# Patient Record
Sex: Female | Born: 1990 | Race: White | Hispanic: No | Marital: Married | Smoking: Current some day smoker
Health system: Southern US, Community
[De-identification: ages and names within clinical notes are randomized; demographics above are authoritative.]

## PROBLEM LIST (undated history)

## (undated) HISTORY — PX: TONSILLECTOMY: SUR1361

---
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
## 2014-07-07 ENCOUNTER — Encounter (HOSPITAL_COMMUNITY): Payer: Self-pay | Admitting: Emergency Medicine

## 2014-07-07 ENCOUNTER — Emergency Department (HOSPITAL_COMMUNITY)
Admission: EM | Admit: 2014-07-07 | Discharge: 2014-07-08 | Disposition: A | Discharge status: Home / Self Care | Payer: 59 | Attending: Emergency Medicine | Admitting: Emergency Medicine

## 2014-07-07 DIAGNOSIS — R1011 Right upper quadrant pain: Secondary | ICD-10-CM

## 2014-07-07 DIAGNOSIS — Z79899 Other long term (current) drug therapy: Secondary | ICD-10-CM | POA: Diagnosis not present

## 2014-07-07 DIAGNOSIS — Z720 Tobacco use: Secondary | ICD-10-CM | POA: Diagnosis not present

## 2014-07-07 DIAGNOSIS — Z3202 Encounter for pregnancy test, result negative: Secondary | ICD-10-CM | POA: Insufficient documentation

## 2014-07-07 DIAGNOSIS — Z793 Long term (current) use of hormonal contraceptives: Secondary | ICD-10-CM | POA: Insufficient documentation

## 2014-07-07 DIAGNOSIS — N3000 Acute cystitis without hematuria: Secondary | ICD-10-CM | POA: Insufficient documentation

## 2014-07-07 DIAGNOSIS — R1084 Generalized abdominal pain: Secondary | ICD-10-CM | POA: Diagnosis present

## 2014-07-07 LAB — CBC WITH DIFFERENTIAL/PLATELET
Basophils Absolute: 0 10*3/uL (ref 0.0–0.1)
Basophils Relative: 0 % (ref 0–1)
EOS PCT: 2 % (ref 0–5)
Eosinophils Absolute: 0.2 10*3/uL (ref 0.0–0.7)
HCT: 40.1 % (ref 36.0–46.0)
Hemoglobin: 13.1 g/dL (ref 12.0–15.0)
LYMPHS ABS: 2.3 10*3/uL (ref 0.7–4.0)
LYMPHS PCT: 23 % (ref 12–46)
MCH: 26.8 pg (ref 26.0–34.0)
MCHC: 32.7 g/dL (ref 30.0–36.0)
MCV: 82.2 fL (ref 78.0–100.0)
Monocytes Absolute: 0.4 10*3/uL (ref 0.1–1.0)
Monocytes Relative: 4 % (ref 3–12)
NEUTROS PCT: 71 % (ref 43–77)
Neutro Abs: 7.2 10*3/uL (ref 1.7–7.7)
PLATELETS: 300 10*3/uL (ref 150–400)
RBC: 4.88 MIL/uL (ref 3.87–5.11)
RDW: 12.6 % (ref 11.5–15.5)
WBC: 10.2 10*3/uL (ref 4.0–10.5)

## 2014-07-07 LAB — COMPREHENSIVE METABOLIC PANEL
ALBUMIN: 3.9 g/dL (ref 3.5–5.2)
ALK PHOS: 70 U/L (ref 39–117)
ALT: 14 U/L (ref 0–35)
AST: 13 U/L (ref 0–37)
Anion gap: 5 (ref 5–15)
BILIRUBIN TOTAL: 0.1 mg/dL — AB (ref 0.3–1.2)
BUN: 9 mg/dL (ref 6–23)
CALCIUM: 9 mg/dL (ref 8.4–10.5)
CHLORIDE: 110 mmol/L (ref 96–112)
CO2: 22 mmol/L (ref 19–32)
Creatinine, Ser: 0.61 mg/dL (ref 0.50–1.10)
Glucose, Bld: 93 mg/dL (ref 70–99)
Potassium: 3.8 mmol/L (ref 3.5–5.1)
Sodium: 137 mmol/L (ref 135–145)
TOTAL PROTEIN: 7.7 g/dL (ref 6.0–8.3)

## 2014-07-07 LAB — LIPASE, BLOOD: Lipase: 16 U/L (ref 11–59)

## 2014-07-07 LAB — POC URINE PREG, ED: PREG TEST UR: NEGATIVE

## 2014-07-07 MED ORDER — ONDANSETRON HCL 4 MG/2ML IJ SOLN
4.0000 mg | Freq: Once | INTRAMUSCULAR | Status: AC
  Administered 2014-07-07: 4 mg via INTRAVENOUS
  Filled 2014-07-07: qty 2

## 2014-07-07 NOTE — ED Notes (Signed)
Pt states that she was seen at [facility] and was sent in for r/o appendicitis. Generalized abdominal pain, afebrile, nausea- no vomiting. Alert and oriented.

## 2014-07-07 NOTE — ED Provider Notes (Signed)
CSN: [id number]     Arrival date & time 07/07/14  2006 History   First MD Initiated Contact with Patient 07/07/14 2311     Chief Complaint  Patient presents with  . Abdominal Pain     (Consider location/radiation/quality/duration/timing/severity/associated sxs/prior Treatment) HPI  This is a 24 year old female with about a 4 year history of episodic abdominal pain. She describes the pain as feeling like gas and is associated with belches that tastes like sulfur. The pain is described as diffuse and moves around. There is equivocal exacerbation with eating. She had an episode developed yesterday that got as severe as an 8 out of 10 earlier today but is now 4 out of 10. It is been associated with nausea and retching but no vomiting. It is been associated with diarrhea. She has not had a fever. She was seen at an [place] [facility] earlier today and was sent to the ED for evaluation for appendicitis.  History reviewed. No pertinent past medical history. No past surgical history on file. No family history on file. History  Substance Use Topics  . Smoking status: Current Some Day Smoker  . Smokeless tobacco: Not on file  . Alcohol Use: Yes     Comment: occasional   OB History    No data available     Review of Systems  All other systems reviewed and are negative.   Allergies  Review of patient's allergies indicates no known allergies.  Home Medications   Prior to Admission medications   Medication Sig Start Date End Date Taking? Authorizing Provider  lisdexamfetamine (VYVANSE) 40 MG capsule Take 40 mg by mouth every morning.   Yes Historical Provider, MD  Melatonin 5 MG TABS Take 1 tablet by mouth daily.   Yes Historical Provider, MD  norethindrone-ethinyl estradiol-iron (MICROGESTIN FE,GILDESS FE,LOESTRIN FE) 1.5-30 MG-MCG tablet Take 1 tablet by mouth daily.   Yes Historical Provider, MD   BP 100/57 mmHg  Pulse 88  Temp(Src) 98.2 F (36.8 C) (Oral)  Resp 18  SpO2 100%  LMP  06/17/2014   Physical Exam  General: Well-developed, obese female in no acute distress; appearance consistent with age of record HENT: normocephalic; atraumatic Eyes: pupils equal, round and reactive to light; extraocular muscles intact Neck: supple Heart: regular rate and rhythm Lungs: clear to auscultation bilaterally Abdomen: soft; nondistended; diffusely tender most prominently in the quadrants; no masses or hepatosplenomegaly; bowel sounds present Extremities: No deformity; full range of motion; pulses normal Neurologic: Awake, alert and oriented; motor function intact in all extremities and symmetric; no facial droop Skin: Warm and dry Psychiatric: Normal mood and affect    ED Course  Procedures (including critical care time)   MDM   Nursing notes and vitals signs, including pulse oximetry, reviewed.  Summary of this visit's results, reviewed by myself:  Labs:  Results for orders placed or performed during the hospital encounter of 07/07/14 (from the past 24 hour(s))  CBC with Differential     Status: None   Collection Time: 07/07/14  9:27 PM  Result Value Ref Range   WBC 10.2 4.0 - 10.5 K/uL   RBC 4.88 3.87 - 5.11 MIL/uL   Hemoglobin 13.1 12.0 - 15.0 g/dL   HCT 30.8 65.7 - 84.6 %   MCV 82.2 78.0 - 100.0 fL   MCH 26.8 26.0 - 34.0 pg   MCHC 32.7 30.0 - 36.0 g/dL   RDW 96.2 95.2 - 84.1 %   Platelets 300 150 - 400 K/uL   Neutrophils  Relative % 71 43 - 77 %   Neutro Abs 7.2 1.7 - 7.7 K/uL   Lymphocytes Relative 23 12 - 46 %   Lymphs Abs 2.3 0.7 - 4.0 K/uL   Monocytes Relative 4 3 - 12 %   Monocytes Absolute 0.4 0.1 - 1.0 K/uL   Eosinophils Relative 2 0 - 5 %   Eosinophils Absolute 0.2 0.0 - 0.7 K/uL   Basophils Relative 0 0 - 1 %   Basophils Absolute 0.0 0.0 - 0.1 K/uL  Comprehensive metabolic panel     Status: Abnormal   Collection Time: 07/07/14  9:27 PM  Result Value Ref Range   Sodium 137 135 - 145 mmol/L   Potassium 3.8 3.5 - 5.1 mmol/L   Chloride 110 96  - 112 mmol/L   CO2 22 19 - 32 mmol/L   Glucose, Bld 93 70 - 99 mg/dL   BUN 9 6 - 23 mg/dL   Creatinine, Ser 1.61 0.50 - 1.10 mg/dL   Calcium 9.0 8.4 - 09.6 mg/dL   Total Protein 7.7 6.0 - 8.3 g/dL   Albumin 3.9 3.5 - 5.2 g/dL   AST 13 0 - 37 U/L   ALT 14 0 - 35 U/L   Alkaline Phosphatase 70 39 - 117 U/L   Total Bilirubin 0.1 (L) 0.3 - 1.2 mg/dL   GFR calc non Af Amer >90 >90 mL/min   GFR calc Af Amer >90 >90 mL/min   Anion gap 5 5 - 15  Lipase, blood     Status: None   Collection Time: 07/07/14  9:27 PM  Result Value Ref Range   Lipase 16 11 - 59 U/L  Urinalysis, Routine w reflex microscopic     Status: Abnormal   Collection Time: 07/07/14 11:36 PM  Result Value Ref Range   Color, Urine YELLOW YELLOW   APPearance CLEAR CLEAR   Specific Gravity, Urine 1.008 1.005 - 1.030   pH 5.5 5.0 - 8.0   Glucose, UA NEGATIVE NEGATIVE mg/dL   Hgb urine dipstick NEGATIVE NEGATIVE   Bilirubin Urine NEGATIVE NEGATIVE   Ketones, ur NEGATIVE NEGATIVE mg/dL   Protein, ur NEGATIVE NEGATIVE mg/dL   Urobilinogen, UA 0.2 0.0 - 1.0 mg/dL   Nitrite NEGATIVE NEGATIVE   Leukocytes, UA MODERATE (A) NEGATIVE  Urine microscopic-add on     Status: Abnormal   Collection Time: 07/07/14 11:36 PM  Result Value Ref Range   Squamous Epithelial / LPF FEW (A) RARE   WBC, UA 11-20 <3 WBC/hpf   Bacteria, UA FEW (A) RARE  POC Urine Pregnancy, ED  (If Pre-menopausal female) - do not order at [facility]     Status: None   Collection Time: 07/07/14 11:45 PM  Result Value Ref Range   Preg Test, Ur NEGATIVE NEGATIVE    Imaging Studies: US Abdomen Limited Ruq  07/16/2014   CLINICAL DATA:  Right upper quadrant pain.  EXAM: US ABDOMEN LIMITED - RIGHT UPPER QUADRANT  COMPARISON:  None.  FINDINGS: Technically challenging examination due to patient body habitus/poor acoustic windows.  Gallbladder:  No gallstones or wall thickening visualized. No sonographic Murphy sign noted.  Common bile duct:  Diameter: 3 mm  Liver:  Mildly  increased in echogenicity Focal lesion detection is limited in this setting.  IMPRESSION: Hepatic steatosis.  No gallstones or sonographic evidence for acute cholecystitis.   Electronically Signed   By: Jearld Lesch M.D.   On: July 16, 2014 02:00    1:00 AM Patient's pain is now more  severe and is located in the right upper quadrant. She is tender in the right upper quadrant with a positive Murphy sign. We will proceed with an abdominal ultrasound.  2:47 AM Pain improved. Abdomen soft with mild right upper quadrant tenderness. Patient advised of unremarkable laboratory studies and ultrasound. Urinalysis is consistent with urinary tract infection. The patient's long-standing symptom of episodic symptoms suggest irritable bowel syndrome and we will refer her to gastroenterology. She was advised to return for acute worsening of pain or localization of the right lower quadrant.    Paula LibraJohn Cynethia Schindler, MD 07/08/14 [phone]

## 2014-07-07 NOTE — ED Notes (Signed)
Patient states abdominal pain "for years" with increased flare up "every few months" patient states it feels like she is bloated and gassy. Patient states she has diarrhea that started yesterday with some nausea and dry heaving. Patient states increased pain "sometimes when she eats"

## 2014-07-08 ENCOUNTER — Encounter: Payer: Self-pay | Admitting: Gastroenterology

## 2014-07-08 ENCOUNTER — Emergency Department (HOSPITAL_COMMUNITY): Payer: 59

## 2014-07-08 LAB — URINALYSIS, ROUTINE W REFLEX MICROSCOPIC
Bilirubin Urine: NEGATIVE
Glucose, UA: NEGATIVE mg/dL
HGB URINE DIPSTICK: NEGATIVE
Ketones, ur: NEGATIVE mg/dL
Nitrite: NEGATIVE
PROTEIN: NEGATIVE mg/dL
Specific Gravity, Urine: 1.008 (ref 1.005–1.030)
Urobilinogen, UA: 0.2 mg/dL (ref 0.0–1.0)
pH: 5.5 (ref 5.0–8.0)

## 2014-07-08 LAB — URINE MICROSCOPIC-ADD ON

## 2014-07-08 MED ORDER — NITROFURANTOIN MONOHYD MACRO 100 MG PO CAPS: 100.0000 mg | ORAL_CAPSULE | Freq: Two times a day (BID) | ORAL | Status: DC

## 2014-07-08 MED ORDER — PANTOPRAZOLE SODIUM 40 MG IV SOLR: 40.0000 mg | Freq: Once | INTRAVENOUS | Status: DC

## 2014-07-08 MED ORDER — NITROFURANTOIN MONOHYD MACRO 100 MG PO CAPS
100.0000 mg | ORAL_CAPSULE | Freq: Once | ORAL | Status: AC
  Administered 2014-07-08: 100 mg via ORAL
  Filled 2014-07-08: qty 1

## 2014-07-08 MED ORDER — FENTANYL CITRATE (PF) 100 MCG/2ML IJ SOLN
100.0000 ug | Freq: Once | INTRAMUSCULAR | Status: AC
  Administered 2014-07-08: 100 ug via INTRAVENOUS
  Filled 2014-07-08: qty 2

## 2014-07-08 NOTE — ED Notes (Signed)
Patient verbalizes understanding of discharge instructions, prescription medications, home care and follow up care. Patient ambulatory out of department at this time with family. 

## 2014-09-01 ENCOUNTER — Encounter: Payer: Self-pay | Admitting: Gastroenterology

## 2014-09-01 ENCOUNTER — Encounter (INDEPENDENT_AMBULATORY_CARE_PROVIDER_SITE_OTHER): Payer: Self-pay

## 2014-09-01 ENCOUNTER — Ambulatory Visit (INDEPENDENT_AMBULATORY_CARE_PROVIDER_SITE_OTHER): Payer: 59 | Admitting: Gastroenterology

## 2014-09-01 VITALS — BP 110/82 | HR 64 | Ht 64.5 in | Wt 295.8 lb

## 2014-09-01 DIAGNOSIS — K5289 Other specified noninfective gastroenteritis and colitis: Secondary | ICD-10-CM

## 2014-09-01 MED ORDER — HYOSCYAMINE SULFATE 0.125 MG SL SUBL: 0.2500 mg | SUBLINGUAL_TABLET | SUBLINGUAL | Status: DC | PRN

## 2014-09-01 NOTE — Progress Notes (Signed)
    _                                                                                                                History of Present Illness:  Ms. Nunez is a 24 year old white female referred at the request of Karmen Stabs, [place], for evaluation of abdominal bloating, excess gas and diarrhea.  She has diffuse abdominal pain may travel throughout her abdomen.  For the past 6-7 years she has experienced episodes of the above.  Episodes may last 3-4 hours at a time.  Occasionally it may last a day.  He finds this occurs during periods distress when she binge eats.  She thinks that she is lactose intolerant to a degree.  In between she feels well.  She denies nausea, pyrosis or rectal bleeding.   History reviewed. No pertinent past medical history. Past Surgical History  Procedure Laterality Date  . Tonsillectomy     family history includes Diabetes in her maternal grandmother. Current Outpatient Prescriptions  Medication Sig Dispense Refill  . lisdexamfetamine (VYVANSE) 40 MG capsule Take 40 mg by mouth every morning.    . Melatonin 5 MG TABS Take 1 tablet by mouth daily.     No current facility-administered medications for this visit.   Allergies as of 09/01/2014  . (No Known Allergies)    reports that she has been smoking.  She does not have any smokeless tobacco history on file. She reports that she drinks alcohol. She reports that she uses illicit drugs (Marijuana).   Review of Systems: Pertinent positive and negative review of systems were noted in the above HPI section. All other review of systems were otherwise negative.  Vital signs were reviewed in today's medical record Physical Exam: General: Obese female in no acute distress Skin: anicteric Head: Normocephalic and atraumatic Eyes:  sclerae anicteric, EOMI Ears: Normal auditory acuity Mouth: No deformity or lesions Neck: Supple, no masses or thyromegaly Lymph Nodes: no lymphadenopathy Lungs: Clear  throughout to auscultation Heart: Regular rate and rhythm; no murmurs, rubs or bruits Gastroinestinal: Soft, non tender and non distended. No masses, hepatosplenomegaly or hernias noted. Normal Bowel sounds Rectal:deferred Musculoskeletal: Symmetrical with no gross deformities  Skin: No lesions on visible extremities Pulses:  Normal pulses noted Extremities: No clubbing, cyanosis, edema or deformities noted Neurological: Alert oriented x 4, grossly nonfocal Cervical Nodes:  No significant cervical adenopathy Inguinal Nodes: No significant inguinal adenopathy Psychological:  Alert and cooperative. Normal mood and affect  See Assessment and Plan under Problem List

## 2014-09-01 NOTE — Patient Instructions (Signed)
Follow up as needed

## 2014-09-01 NOTE — Assessment & Plan Note (Signed)
7 year history of intermittent episodes of abdominal bloating, excess flatus and diarrhea.  I suspect that she is experiencing episodes of maldigestion of certain food products.  She may have limited lactose intolerance and exceed a threshold when she binge eats.  There may be other identifiable foodstuffs that she does not digest.  Recommendations #1 I carefully instructed the patient to keep a diary of food ingestion when she does have episodes in the hopes of identifying certain foods that she may not tolerate. #2 hyomax sublingual when necessary for episodes of abdominal pain

## 2016-06-29 ENCOUNTER — Inpatient Hospital Stay
Admission: AD | Admit: 2016-06-29 | Discharge: 2016-07-02 | DRG: 885 | Disposition: A | Discharge status: Home / Self Care | Payer: BLUE CROSS/BLUE SHIELD | Source: Intra-hospital | Attending: Psychiatry | Admitting: Psychiatry

## 2016-06-29 ENCOUNTER — Encounter (HOSPITAL_COMMUNITY): Payer: Self-pay | Admitting: Emergency Medicine

## 2016-06-29 ENCOUNTER — Emergency Department (HOSPITAL_COMMUNITY)
Admission: EM | Admit: 2016-06-29 | Discharge: 2016-06-29 | Disposition: A | Discharge status: Home / Self Care | Payer: BLUE CROSS/BLUE SHIELD | Attending: Emergency Medicine | Admitting: Emergency Medicine

## 2016-06-29 ENCOUNTER — Encounter: Payer: Self-pay | Admitting: *Deleted

## 2016-06-29 DIAGNOSIS — R45851 Suicidal ideations: Secondary | ICD-10-CM | POA: Diagnosis present

## 2016-06-29 DIAGNOSIS — F17210 Nicotine dependence, cigarettes, uncomplicated: Secondary | ICD-10-CM | POA: Insufficient documentation

## 2016-06-29 DIAGNOSIS — Z915 Personal history of self-harm: Secondary | ICD-10-CM

## 2016-06-29 DIAGNOSIS — F3181 Bipolar II disorder: Secondary | ICD-10-CM | POA: Diagnosis present

## 2016-06-29 DIAGNOSIS — F329 Major depressive disorder, single episode, unspecified: Secondary | ICD-10-CM | POA: Insufficient documentation

## 2016-06-29 DIAGNOSIS — Z888 Allergy status to other drugs, medicaments and biological substances status: Secondary | ICD-10-CM

## 2016-06-29 DIAGNOSIS — Z7289 Other problems related to lifestyle: Secondary | ICD-10-CM

## 2016-06-29 DIAGNOSIS — Z818 Family history of other mental and behavioral disorders: Secondary | ICD-10-CM | POA: Diagnosis not present

## 2016-06-29 DIAGNOSIS — Z79899 Other long term (current) drug therapy: Secondary | ICD-10-CM | POA: Insufficient documentation

## 2016-06-29 DIAGNOSIS — Z136 Encounter for screening for cardiovascular disorders: Secondary | ICD-10-CM

## 2016-06-29 DIAGNOSIS — F4310 Post-traumatic stress disorder, unspecified: Secondary | ICD-10-CM | POA: Diagnosis present

## 2016-06-29 DIAGNOSIS — F319 Bipolar disorder, unspecified: Secondary | ICD-10-CM | POA: Diagnosis present

## 2016-06-29 DIAGNOSIS — F32A Depression, unspecified: Secondary | ICD-10-CM

## 2016-06-29 DIAGNOSIS — F1290 Cannabis use, unspecified, uncomplicated: Secondary | ICD-10-CM | POA: Diagnosis present

## 2016-06-29 DIAGNOSIS — F603 Borderline personality disorder: Secondary | ICD-10-CM

## 2016-06-29 DIAGNOSIS — F410 Panic disorder [episodic paroxysmal anxiety] without agoraphobia: Secondary | ICD-10-CM | POA: Diagnosis present

## 2016-06-29 DIAGNOSIS — F29 Unspecified psychosis not due to a substance or known physiological condition: Secondary | ICD-10-CM | POA: Insufficient documentation

## 2016-06-29 DIAGNOSIS — F314 Bipolar disorder, current episode depressed, severe, without psychotic features: Secondary | ICD-10-CM

## 2016-06-29 DIAGNOSIS — F17200 Nicotine dependence, unspecified, uncomplicated: Secondary | ICD-10-CM | POA: Diagnosis present

## 2016-06-29 DIAGNOSIS — E669 Obesity, unspecified: Secondary | ICD-10-CM

## 2016-06-29 LAB — RAPID URINE DRUG SCREEN, HOSP PERFORMED
AMPHETAMINES: NOT DETECTED
BENZODIAZEPINES: POSITIVE — AB
Barbiturates: NOT DETECTED
COCAINE: NOT DETECTED
OPIATES: NOT DETECTED
Tetrahydrocannabinol: NOT DETECTED

## 2016-06-29 LAB — COMPREHENSIVE METABOLIC PANEL
ALBUMIN: 3.7 g/dL (ref 3.5–5.0)
ALT: 22 U/L (ref 14–54)
ANION GAP: 9 (ref 5–15)
AST: 18 U/L (ref 15–41)
Alkaline Phosphatase: 65 U/L (ref 38–126)
BUN: 11 mg/dL (ref 6–20)
CHLORIDE: 103 mmol/L (ref 101–111)
CO2: 27 mmol/L (ref 22–32)
Calcium: 9.4 mg/dL (ref 8.9–10.3)
Creatinine, Ser: 0.78 mg/dL (ref 0.44–1.00)
GFR calc non Af Amer: >60 mL/min (ref >60)
Glucose, Bld: 88 mg/dL (ref 65–99)
Potassium: 3.8 mmol/L (ref 3.5–5.1)
SODIUM: 139 mmol/L (ref 135–145)
Total Bilirubin: 0.2 mg/dL — ABNORMAL LOW (ref 0.3–1.2)
Total Protein: 6.5 g/dL (ref 6.5–8.1)

## 2016-06-29 LAB — CBC
HCT: 40 % (ref 36.0–46.0)
HEMOGLOBIN: 13 g/dL (ref 12.0–15.0)
MCH: 27.1 pg (ref 26.0–34.0)
MCHC: 32.5 g/dL (ref 30.0–36.0)
MCV: 83.3 fL (ref 78.0–100.0)
Platelets: 332 10*3/uL (ref 150–400)
RBC: 4.8 MIL/uL (ref 3.87–5.11)
RDW: 12.5 % (ref 11.5–15.5)
WBC: 7.4 10*3/uL (ref 4.0–10.5)

## 2016-06-29 LAB — ACETAMINOPHEN LEVEL

## 2016-06-29 LAB — ETHANOL: Alcohol, Ethyl (B): <5 mg/dL (ref <5)

## 2016-06-29 LAB — SALICYLATE LEVEL: Salicylate Lvl: <7 mg/dL (ref 2.8–30.0)

## 2016-06-29 LAB — PREGNANCY, URINE: Preg Test, Ur: NEGATIVE

## 2016-06-29 MED ORDER — LURASIDONE HCL 40 MG PO TABS
20.0000 mg | ORAL_TABLET | Freq: Every day | ORAL | Status: DC
  Administered 2016-06-29 – 2016-07-01 (×3): 20 mg via ORAL
  Filled 2016-06-29 (×3): qty 1

## 2016-06-29 MED ORDER — ACETAMINOPHEN 325 MG PO TABS: 650.0000 mg | ORAL_TABLET | Freq: Four times a day (QID) | ORAL | Status: DC | PRN

## 2016-06-29 MED ORDER — ALUM & MAG HYDROXIDE-SIMETH 200-200-20 MG/5ML PO SUSP: 30.0000 mL | ORAL | Status: DC | PRN

## 2016-06-29 MED ORDER — HYDROXYZINE HCL 25 MG PO TABS
25.0000 mg | ORAL_TABLET | ORAL | Status: DC | PRN
  Administered 2016-07-01: 25 mg via ORAL
  Filled 2016-06-29: qty 1

## 2016-06-29 MED ORDER — MAGNESIUM HYDROXIDE 400 MG/5ML PO SUSP: 30.0000 mL | Freq: Every day | ORAL | Status: DC | PRN

## 2016-06-29 MED ORDER — NICOTINE 14 MG/24HR TD PT24
14.0000 mg | MEDICATED_PATCH | Freq: Every day | TRANSDERMAL | Status: DC
  Filled 2016-06-29 (×2): qty 1

## 2016-06-29 MED ORDER — TRAZODONE HCL 100 MG PO TABS
100.0000 mg | ORAL_TABLET | Freq: Every evening | ORAL | Status: DC | PRN
  Administered 2016-06-30 – 2016-07-01 (×2): 100 mg via ORAL
  Filled 2016-06-29 (×2): qty 1

## 2016-06-29 NOTE — ED Notes (Signed)
Pt placed in maroon scrubs, personal belongings given to pt's father.

## 2016-06-29 NOTE — Progress Notes (Signed)
Per Joni Reining at [facility], Pt has been accepted to [facility], bed 324-A; accepting provider Dr. Ardyth Harps. Pt is voluntary and will be transported by [facility]. Pt can arrive once consent is received by [facility]. Please call report to [phone].  Clark RN aware.   Vernie Shanks, LCSW Clinical Social Work [phone]
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
## 2016-06-29 NOTE — Progress Notes (Signed)
Pt admitted. Alert and orient x4. Denies SI, currently but reports she feels safer being in hospital. Pt denies HI, AVH. Pt guarded, forwards little.  Oriented patient to room and unit. Skin assessment completed and witnessed by Jamesetta So, Charity fundraiser. No skin issues noted other than scratch to outer left leg. No contraband found. Fluids and nutrition offered. Pt remains safe on unit with q 15 min checks.

## 2016-06-29 NOTE — ED Provider Notes (Addendum)
[facility]-EMERGENCY DEPT Provider Note   CSN: [id number] Arrival date & time: 06/29/16  0320     History   Chief Complaint Chief Complaint  Patient presents with  . Psychiatric Evaluation    HPI Rebekah Burnett is a 26 y.o. female.  This a 26 year old with a long-standing history of anxiety, bipolar disease, was recently taken off lithium due to a rash, but she felt like this was helping her.  The most with her mood stabilization.  Tonight she presents with feeling like her bones don't belong in her body.  States she knows that this is unrealistic but can help assessing over this.  She states she started feeling this way yesterday and is just gotten worse through the day and night. She has been in counseling on and off since age 64 none recently      History reviewed. No pertinent past medical history.  Patient Active Problem List   Diagnosis Date Noted  . Bipolar disorder ([facility]) 06/29/2016  . Borderline personality disorder 06/29/2016  . Post traumatic stress disorder (PTSD) 06/29/2016  . Obesity 06/29/2016    Past Surgical History:  Procedure Laterality Date  . TONSILLECTOMY      OB History    No data available       Home Medications    Prior to Admission medications   Medication Sig Start Date End Date Taking? Authorizing Provider  hyoscyamine (LEVSIN SL) 0.125 MG SL tablet Place 2 tablets (0.25 mg total) under the tongue every 4 (four) hours as needed. Patient taking differently: Place 0.25 mg under the tongue every 4 (four) hours as needed for cramping.  09/01/14  Yes Louis Meckel, MD  modafinil (PROVIGIL) 200 MG tablet Take 200 mg by mouth 2 (two) times a week.   Yes Historical Provider, MD    Family History Family History  Problem Relation Age of Onset  . Diabetes Maternal Grandmother     Social History Social History  Substance Use Topics  . Smoking status: Current Some Day Smoker    Types: Cigarettes  . Smokeless tobacco: Never Used  . Alcohol  use Yes     Comment: occasional     Allergies   Lamictal [lamotrigine]   Review of Systems Review of Systems  Constitutional: Negative for fever.  Respiratory: Negative for chest tightness.   Cardiovascular: Negative for chest pain.  Gastrointestinal: Negative for abdominal distention.  Musculoskeletal: Negative for arthralgias.  Neurological: Negative for headaches.  Psychiatric/Behavioral: Positive for dysphoric mood. The patient is nervous/anxious.   All other systems reviewed and are negative.    Physical Exam Updated Vital Signs BP 109/62   Pulse 64   Temp 98.4 F (36.9 C)   Resp 15   Ht  (1.651 m)   Wt 136.1 kg   LMP 06/09/2016   SpO2 98%   BMI 49.92 kg/m   Physical Exam  Constitutional: She appears well-developed and well-nourished.  HENT:  Head: Normocephalic.  Eyes: Pupils are equal, round, and reactive to light.  Neck: Normal range of motion.  Cardiovascular: Normal rate.   Pulmonary/Chest: Effort normal.  Musculoskeletal: Normal range of motion.  Neurological: She is alert.  Skin: Skin is warm.  Psychiatric: Her speech is normal and behavior is normal. Judgment normal. Her mood appears anxious. Thought content is delusional. Cognition and memory are normal. She exhibits a depressed mood.  Nursing note and vitals reviewed.    ED Treatments / Results  Labs (all labs ordered are listed, but only  abnormal results are displayed) Labs Reviewed  COMPREHENSIVE METABOLIC PANEL - Abnormal; Notable for the following:       Result Value   Total Bilirubin 0.2 (*)    All other components within normal limits  ACETAMINOPHEN LEVEL - Abnormal; Notable for the following:    Acetaminophen (Tylenol), Serum <10 (*)    All other components within normal limits  RAPID URINE DRUG SCREEN, HOSP PERFORMED - Abnormal; Notable for the following:    Benzodiazepines POSITIVE (*)    All other components within normal limits  ETHANOL  SALICYLATE LEVEL  CBC    PREGNANCY, URINE    EKG  EKG Interpretation None       Radiology No results found.  Procedures Procedures (including critical care time)  Medications Ordered in ED Medications - No data to display   Initial Impression / Assessment and Plan / ED Course  I have reviewed the triage vital signs and the nursing notes.  Pertinent labs & imaging results that were available during my care of the patient were reviewed by me and considered in my medical decision making (see chart for details).      Will obtain medical screening labs and have TTS evaluation  Final Clinical Impressions(s) / ED Diagnoses   Final diagnoses:  Depression, unspecified depression type  Psychosis, unspecified psychosis type    New Prescriptions Discharge Medication List as of 06/29/2016  3:22 PM       Earley Favor, NP 06/29/16 0418    Layla Maw Ward, DO 06/29/16 0425    Earley Favor, NP 06/29/16 1956    Layla Maw Ward, DO 07/01/16 2359

## 2016-06-29 NOTE — ED Notes (Addendum)
Pt being reviewed for possible admission to [facility]. H&P and Assessment have been faxed to the [facility] Unit for the charge nurse to review.  Nicole Lizeth Bencosme, MS, NCC, LPC Therapeutic Triage Specialist    

## 2016-06-29 NOTE — ED Notes (Signed)
[facility] contacted to transport patient to [place]  

## 2016-06-29 NOTE — ED Notes (Signed)
Patient is to be admitted to [facility] by Dr. Ardyth Harps .  Attending Physician will be Dr. Ardyth Harps.   Patient has been assigned to room 324, by [facility] Charge Nurse [place] .   Intake Paper Work has been signed and placed on patient chart.  Patient Access is aware of the admission. Representative was lauren who has agreed to fax admission paper work to [phone] Please call reports when pt is leaving the facility to [phone]

## 2016-06-29 NOTE — ED Triage Notes (Signed)
Per pt she states that she is experiencing "a psychotic episode.Marland KitchenMarland KitchenMarland KitchenI guess.Marland Kitchenanxiety"  She states that she feels like she needs to harm herself to "feel right", no exact plan.  She has been treated in the past but [facility].  She did take a couple xanax tonight to help her deal w/ the feelings.

## 2016-06-29 NOTE — ED Notes (Signed)
Pt in room with father- calm and cooperative
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
## 2016-06-29 NOTE — Tx Team (Signed)
Initial Treatment Plan 06/29/2016 5:20 PM ANESIA BLACKWELL ZOX:[id number]    PATIENT STRESSORS: Marital or family conflict Substance abuse   PATIENT STRENGTHS: Ability for insight Capable of independent living Communication skills General fund of knowledge   PATIENT IDENTIFIED PROBLEMS: Depression  Anxiety  Suicidal Ideation                 DISCHARGE CRITERIA:  Improved stabilization in mood, thinking, and/or behavior  PRELIMINARY DISCHARGE PLAN: Outpatient therapy  PATIENT/FAMILY INVOLVEMENT: This treatment plan has been presented to and reviewed with the patient, Rebekah Burnett, and/or family member, .  The patient and family have been given the opportunity to ask questions and make suggestions.  Shelia Media, RN 06/29/2016, 5:20 PM

## 2016-06-30 LAB — TSH: TSH: 3.222 u[IU]/mL (ref 0.350–4.500)

## 2016-06-30 LAB — LIPID PANEL
CHOLESTEROL: 162 mg/dL (ref 0–200)
HDL: 40 mg/dL — ABNORMAL LOW (ref >40)
LDL CALC: 107 mg/dL — AB (ref 0–99)
TRIGLYCERIDES: 77 mg/dL (ref <150)
Total CHOL/HDL Ratio: 4.1 RATIO
VLDL: 15 mg/dL (ref 0–40)

## 2016-06-30 NOTE — Progress Notes (Signed)
Denies SI/HI/AVH. Affect blunted. Attended evening group and noted to be in dayroom more during free time socializing with peers occasionally  Denies pain.  Voices no concerns at this time. Encouragement and support offered. Safety maintained. Will continue to monitor.

## 2016-06-30 NOTE — Progress Notes (Signed)
Denies SI/HI/AVH. Pt did not attend evening group. Frequently isolated to room. Forwards little at this time. Voices no concerns at this time. Encouragement and support offered. Safety maintained. Will continue to monitor.

## 2016-06-30 NOTE — Plan of Care (Signed)
Problem: Coping: Goal: Ability to verbalize frustrations and anger appropriately will improve Outcome: Not Progressing Cautious, forwards little

## 2016-06-30 NOTE — [facility] Suicide Risk Assessment (Signed)
[facility] INPATIENT:  Family/Significant Other Suicide Prevention Education  Suicide Prevention Education:  Education Completed;Rebekah Burnett(father [phone]), has been identified by the patient as the family member/significant other with whom the patient will be residing, and identified as the person(s) who will aid the patient in the event of a mental health crisis (suicidal ideations/suicide attempt).  With written consent from the patient, the family member/significant other has been provided the following suicide prevention education, prior to the and/or following the discharge of the patient.  The suicide prevention education provided includes the following:  Suicide risk factors  Suicide prevention and interventions  National Suicide Hotline telephone number  [facility] assessment telephone number  [facility] Emergency Assistance 911  [facility] and/or Residential Mobile Crisis Unit telephone number  Request made of family/significant other to:  Remove weapons (e.g., guns, rifles, knives), all items previously/currently identified as safety concern.    Remove drugs/medications (over-the-counter, prescriptions, illicit drugs), all items previously/currently identified as a safety concern.  The family member/significant other verbalizes understanding of the suicide prevention education information provided.  The family member/significant other agrees to remove the items of safety concern listed above.  Rebekah Burnett MSW, LCSWA 06/30/2016, 5:06 PM

## 2016-06-30 NOTE — [facility] Group Notes (Signed)
[facility] LCSW Group Therapy  06/30/2016 3:01 PM  Type of Therapy:  Group Therapy  Participation Level:  Minimal  Participation Quality:  Attentive  Affect:  Flat  Cognitive:  Alert  Insight:  Engaged  Engagement in Therapy:  Developing/Improving  Modes of Intervention:  Activity, Clarification, Discussion, Education, Exploration, Problem-solving, Reality Testing, Socialization and Support  Summary of Progress/Problems: Self esteem: Patients discussed self esteem and how it impacts them. They discussed what aspects in their lives has influenced their self esteem. They were challenged to identify changes that are needed in order to improve self esteem. Patients participated in activity where they had to identify positive adjectives they felt described their personality. Patients shared with the group on the following areas: Things I am good at, What I like about my appearance, I've helped others by, What I value the most, compliments I have received, challenges I have overcome, thing that make me unique, and Times I've made others happy.   Farzana Koci G. Garnette Czech MSW, LCSWA  06/30/2016, 3:03 PM

## 2016-06-30 NOTE — [facility] Group Notes (Signed)
[facility] Group Notes:  (Nursing/MHT/Case Management/Adjunct)  Date:  06/30/2016  Time:  1:01 AM  Type of Therapy:  Group Therapy  Participation Level:  Did Not Attend   Rebekah Burnett 06/30/2016, 1:01 AM

## 2016-06-30 NOTE — Progress Notes (Signed)
Affect flat.  Verbally denies SI although on self inventory professes to passive SI. States "I just don't feel like myself"  Guarded and reluctant to talk about feelings.  Visible in the dayroom.  Minimal interaction noted with peers.  Support ane encouragement offered.  Safety checks maintained.
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
## 2016-07-01 LAB — PROLACTIN: Prolactin: 36.9 ng/mL — ABNORMAL HIGH (ref 4.8–23.3)

## 2016-07-01 LAB — HEMOGLOBIN A1C
HEMOGLOBIN A1C: 5.2 % (ref 4.8–5.6)
MEAN PLASMA GLUCOSE: 103 mg/dL

## 2016-07-01 LAB — VITAMIN B12: VITAMIN B 12: 346 pg/mL (ref 180–914)

## 2016-07-01 NOTE — [facility] Group Notes (Signed)
[facility] Group Notes:  (Nursing/MHT/Case Management/Adjunct)  Date:  07/01/2016  Time:  9:39 PM  Type of Therapy:  Evening Wrap-up Group  Participation Level:  Minimal  Participation Quality:  Attentive  Affect:  Flat  Cognitive:  Appropriate  Insight:  Limited  Engagement in Group:  Limited  Modes of Intervention:  Discussion  Summary of Progress/Problems:  Rebekah Burnett 07/01/2016, 9:39 PM

## 2016-07-01 NOTE — [facility] Group Notes (Signed)
[facility] Group Notes:  (Nursing/MHT/Case Management/Adjunct)  Date:  07/01/2016  Time:  2:01 AM  Type of Therapy:  Psychoeducational Skills  Participation Level:  Active  Participation Quality:  Appropriate  Affect:  Appropriate  Cognitive:  Alert  Insight:  Good  Engagement in Group:  Supportive  Modes of Intervention:  Support  Summary of Progress/Problems:  Mayra Neer 07/01/2016, 2:01 AM

## 2016-07-01 NOTE — Progress Notes (Signed)
Pt in bed for most of the morning. Encouraged pt to come out of room for lunch but pt states that she is tired. Pt reports not eating breakfast this am because of "not being hungry".  Pt denies SI at this time and voices no complaints other than being tired. Pt supported emotionally and encouraged to express concerns and ask questions.  Pt remains safe with 15 minute checks. Will continue POC.

## 2016-07-01 NOTE — Progress Notes (Signed)
[facility] MD Progress Note  07/01/2016 6:06 PM Rebekah Burnett  MRN:  [id number]    Subjective:    [name] is a 26 year old single Caucasian female who was brought to the emergency room with suicidal thoughts in the context of relationship problems. Her boyfriend cheated on her with her best friend as well as her brother. She is followed by the mood disorders clinic in [place] and has been compliant with treatment.   The patient slept over 6 hours but has been napping throughout the daytime today. She has been reluctant to come out of her room because she yells as a unit has gotten more crowded, she does not want to be around a lot of people. She says she feels very uncomfortable and anxious around large groups of people. She did come out for meals but appetite has been low. Affect was sad and depressed today. Her dad did visit yesterday and her brother may come today. She reports that dissociation helps with her anxiety. She says that when she is detached, she does not feel anxious or panicky. She did endorse some vague passive suicidal thoughts that she would never act on them. She denies any current auditory or visual hallucinations. No paranoid thoughts or delusions. She denies any somatic complaints. She did not attend any groups today. So far, she is tolerating the Latuda fairly well without any physical adverse side effects. Vital signs are stable.  Dialectical behavior therapy techniques discussed help reinforce distress tolerance skills to improve anxiety. She was encouraged to use "wise mind".   Past Psychiatric History The patient denies any prior inpatient psychiatric hospitalizations or suicide attempts in the past but does have a history of cutting since age of 16 up until a few months ago. She'll cut superficially on her upper extremities. She has been seeing a psychiatrist since the age of 24 but more recently was not [place] [facility] in [place]. She does have a  therapist there as well as a psychiatrist. She did well on that dose for a long period of time but then had a rash with the Lamictal. She is not currently on any psychotropic medications as an outpatient. She says in the past, she was on Zoloft, Ambien, Xanax and Vyvanse. She cannot remember any other medications.  Family psychiatric history The patient's father struggles with depression and she has a brother with autism spectrum disorder   Past medical history Obesity She denies any history of any prior TB or seizures   Substance abuse history: The patient says that she drinks alcohol only once or twice a month and denies any heavy alcohol use. He says she only drinks about 1 or 2 drinks each time. She says she uses marijuana approximately once a month but urine tox screen was negative for all substances. She denies any history of any cocaine, heroin, opiate or similar issues. She denies any history of any cigarette use but does smoke Hookah once a week.   Legal history: The patient denies any history of any prior arrests or incarcerations.   Principal Problem: <principal problem not specified> Diagnosis:   Patient Active Problem List   Diagnosis Date Noted  . Bipolar disorder (HCC) [F31.9] 06/29/2016    Priority: High  . Borderline personality disorder [F60.3] 06/29/2016    Priority: High  . Post traumatic stress disorder (PTSD) [F43.10] 06/29/2016    Priority: Medium  . Obesity [E66.9] 06/29/2016   Total Time spent with patient: 20 minutes  Past Medical History: History  reviewed. No pertinent past medical history.  Past Surgical History:  Procedure Laterality Date  . TONSILLECTOMY     Family History:  Family History  Problem Relation Age of Onset  . Diabetes Maternal Grandmother    Social History:  History  Alcohol Use  . Yes    Comment: occasional     History  Drug Use  . Types: Marijuana    Social History   Social History  . Marital status: Married     Spouse name: N/A  . Number of children: N/A  . Years of education: N/A   Occupational History  . Student    Social History Main Topics  . Smoking status: Current Some Day Smoker    Types: Cigarettes  . Smokeless tobacco: Never Used  . Alcohol use Yes     Comment: occasional  . Drug use: Yes    Types: Marijuana  . Sexual activity: Not Asked   Other Topics Concern  . None   Social History Narrative  . None      Sleep: Fair  Appetite:  Good  Current Medications: Current Facility-Administered Medications  Medication Dose Route Frequency Provider Last Rate Last Dose  . acetaminophen (TYLENOL) tablet 650 mg  650 mg Oral Q6H PRN Jimmy Footman, MD      . alum & mag hydroxide-simeth (MAALOX/MYLANTA) 200-200-20 MG/5ML suspension 30 mL  30 mL Oral Q4H PRN Jimmy Footman, MD      . hydrOXYzine (ATARAX/VISTARIL) tablet 25 mg  25 mg Oral Q4H PRN Jimmy Footman, MD      . lurasidone (LATUDA) tablet 20 mg  20 mg Oral Q supper Darliss Ridgel, MD   20 mg at 07/01/16 1653  . magnesium hydroxide (MILK OF MAGNESIA) suspension 30 mL  30 mL Oral Daily PRN Jimmy Footman, MD      . nicotine (NICODERM CQ - dosed in mg/24 hours) patch 14 mg  14 mg Transdermal Daily Jimmy Footman, MD      . traZODone (DESYREL) tablet 100 mg  100 mg Oral QHS PRN Jimmy Footman, MD   100 mg at 06/30/16 2222    Lab Results:  Results for orders placed or performed during the hospital encounter of 06/29/16 (from the past 48 hour(s))  Prolactin     Status: Abnormal   Collection Time: 06/30/16  7:38 AM  Result Value Ref Range   Prolactin 36.9 (H) 4.8 - 23.3 ng/mL    Comment: (NOTE) Performed At: [facility] [address] [place], [place] [id number] Mila Homer MD [id number]   Lipid panel     Status: Abnormal   Collection Time: 06/30/16  7:38 AM  Result Value Ref Range   Cholesterol 162 0 - 200 mg/dL   Triglycerides 77 <956  mg/dL   HDL 40 (L) >21 mg/dL   Total CHOL/HDL Ratio 4.1 RATIO   VLDL 15 0 - 40 mg/dL   LDL Cholesterol 308 (H) 0 - 99 mg/dL    Comment:        Total Cholesterol/HDL:CHD Risk Coronary Heart Disease Risk Table                     Men   Women  1/2 Average Risk   3.4   3.3  Average Risk       5.0   4.4  2 X Average Risk   9.6   7.1  3 X Average Risk  23.4   11.0  Use the calculated Patient Ratio above and the CHD Risk Table to determine the patient's CHD Risk.        ATP III CLASSIFICATION (LDL):  <100     mg/dL   Optimal  401-027  mg/dL   Near or Above                    Optimal  130-159  mg/dL   Borderline  253-664  mg/dL   High  >403     mg/dL   Very High   TSH     Status: None   Collection Time: 06/30/16  7:38 AM  Result Value Ref Range   TSH 3.222 0.350 - 4.500 uIU/mL    Comment: Performed by a 3rd Generation assay with a functional sensitivity of <=0.01 uIU/mL.  Hemoglobin A1c     Status: None   Collection Time: 06/30/16  7:38 AM  Result Value Ref Range   Hgb A1c MFr Bld 5.2 4.8 - 5.6 %    Comment: (NOTE)         Pre-diabetes: 5.7 - 6.4         Diabetes: >6.4         Glycemic control for adults with diabetes: <7.0    Mean Plasma Glucose 103 mg/dL    Comment: (NOTE) Performed At: [facility] [address] [place], [place] [id number] Mila Homer MD OV:[id number]   Vitamin B12     Status: None   Collection Time: 06/30/16  7:38 AM  Result Value Ref Range   Vitamin B-12 346 180 - 914 pg/mL    Comment: (NOTE) This assay is not validated for testing neonatal or myeloproliferative syndrome specimens for Vitamin B12 levels. Performed at [facility], [address], [place], [place] [address]     Blood Alcohol level:  Lab Results  Component Value Date   [facility] <5 06/29/2016    Metabolic Disorder Labs: Lab Results  Component Value Date   HGBA1C 5.2 06/30/2016   MPG 103 06/30/2016   Lab Results  Component Value Date   PROLACTIN  36.9 (H) 06/30/2016   Lab Results  Component Value Date   CHOL 162 06/30/2016   TRIG 77 06/30/2016   HDL 40 (L) 06/30/2016   CHOLHDL 4.1 06/30/2016   VLDL 15 06/30/2016   LDLCALC 107 (H) 06/30/2016    Physical Findings: AIMS: Facial and Oral Movements Muscles of Facial Expression: None, normal Lips and Perioral Area: None, normal Jaw: None, normal Tongue: None, normal,Extremity Movements Upper (arms, wrists, hands, fingers): None, normal Lower (legs, knees, ankles, toes): None, normal, Trunk Movements Neck, shoulders, hips: None, normal, Overall Severity Severity of abnormal movements (highest score from questions above): None, normal Incapacitation due to abnormal movements: None, normal Patient's awareness of abnormal movements (rate only patient's report): No Awareness, Dental Status Current problems with teeth and/or dentures?: No Does patient usually wear dentures?: No  CIWA:  CIWA-Ar Total: 1 COWS:     Musculoskeletal: Strength & Muscle Tone: within normal limits Gait & Station: normal Patient leans: N/A  Psychiatric Specialty Exam: Physical Exam   Review of Systems  Constitutional: Negative.  Negative for chills, fever and weight loss.  HENT: Negative.  Negative for ear pain, hearing loss and tinnitus.   Eyes: Negative.  Negative for blurred vision, double vision, photophobia and pain.  Respiratory: Negative.  Negative for cough, hemoptysis, sputum production, shortness of breath and wheezing.   Cardiovascular: Negative.  Negative for chest pain and  palpitations.  Gastrointestinal: Negative.  Negative for abdominal pain, diarrhea, heartburn, nausea and vomiting.  Genitourinary: Negative.   Musculoskeletal: Negative.   Skin: Negative.  Negative for itching and rash.  Neurological: Negative.  Negative for dizziness, tingling, tremors, sensory change and headaches.  Endo/Heme/Allergies: Negative.  Does not bruise/bleed easily.    Blood pressure 113/80, pulse 77,  temperature 98 F (36.7 C), temperature source Oral, resp. rate 18, height  (1.651 m), weight 132 kg (291 lb), last menstrual period 06/09/2016, SpO2 98 %.Body mass index is 48.42 kg/m.  General Appearance: Casual  Eye Contact:  Good  Speech:  Clear and Coherent and Normal Rate  Volume:  Normal  Mood:  Depressed  Affect:  Flat  Thought Process:  Coherent, Goal Directed and Linear  Orientation:  Full (Time, Place, and Person)  Thought Content:  Logical  Suicidal Thoughts:  Yes.  without intent/plan  Homicidal Thoughts:  No  Memory:  Immediate;   Good Recent;   Good Remote;   Good  Judgement:  Good  Insight:  Good  Psychomotor Activity:  Normal  Concentration:  Concentration: Good and Attention Span: Good  Recall:  Good  Fund of Knowledge:  Good  Language:  Good  Akathisia:  No  Handed:  Right  AIMS (if indicated):     Assets:  Communication Skills Desire for Improvement Financial Resources/Insurance Housing Transportation  ADL's:  Intact  Cognition:  WNL  Sleep:  Number of Hours: 6.3     Treatment Plan Summary:  Diagnosis: Bipolar disorder, type II Borderline personality disorder PTSD Cannabis use disorder, mild Obesity Moderate: Relationship conflict, mother's death  Ms. Ende is a 20 year old single Caucasian female with a history of Bipolar disorder, borderline personality disorder who was brought to the medicine emergency room by her father after endorsing suicidal thoughts with a plan to drive her car into a tree. The patient was transferred to [facility] inpatient psychiatry for medication management, safety and stabilization and placed on suicide precautions. The patient was able to contract for safety inside of the hospital.  Bipolar Disorder, Borderline personality disorder, PTSD: The patient did have an allergic reaction to Lamictal. It is not clear whether or not the rash was actually representative of Stevens-Johnson syndrome  or not. She did not undergo any Dermatological testing for Stevens-Johnson. We'll try to avoid his many medications as possible that cross react with Lamictal.  She was started on Latuda at 20 mg by mouth daily with dinner and will increase to  po daily.  It is unclear the patient is having any true psychosis but visual hallucinations may be more mood congruent. The patient was educated on metabolic side effects associated with Latuda. TSH was 3.2. Total cholesterol was 162. Hemoglobin A1c was 5.2 and Prolactin was 36.9. Marland KitchenEKG showed a QTc of 460  The patient would benefit from dialectical behavior therapy in the community and will continue  reinforcing DBT skills when she is on the inpatient unit.  Cannabis use disorder, mild: The patient was advised to abstain from marijuana and all of the drugs if any worsening symptoms. No tobacco use  Obesity: The patient was advised to exercise on a regular basis for at least 30 minutes, 3-5 times a week as an outpatient. She was also asked to monitor her diet low in carbohydrates and fat as an outpatient to help lose weight.  Disposition: The patient does have a stable living situation in [place]. It is recommended that she return home with  family however for safety. At this time, the patient does not want her father contacted but will try again tomorrow to see if she is agreeable to having her father involved in her treatment.   Daily contact with patient to assess and evaluate symptoms and progress in treatment and Medication management  Levora Angel, MD 07/01/2016, 6:06 PM

## 2016-07-01 NOTE — Progress Notes (Signed)
Pt out of room to eat dinner. States that she "feels overwhelmed today for some reason". Pt rates her depression an 8/10, hopelessness a 10/10 and anxiety a 6/10. Supported pt emotionally and encouraged pt to stay out of room. Pt remains safe with 15 minute checks

## 2016-07-01 NOTE — [facility] Group Notes (Signed)
[facility] LCSW Group Therapy  07/01/2016 3:08 PM  Type of Therapy:  Group Therapy  Participation Level:  Patient did not attend group. CSW invited patient to group.   Summary of Progress/Problems: Self-responsibility/accountability- Patients discussed self responsibility/accountability  and how it impacts them. Patients were asked to define these concepts in their own words. They discussed taking ownership of their actions and the challenges they have with taking accountability for their self. CSW discussed how to improve their communication with others. Examples were provided of each. They were challenged to identify changes that are needed in order to improve self responsibility/accountability. CSW provided inspirational quotes that focused on the patients taking accountability for actions both good and bad.  Rebekah Burnett G. Garnette Czech MSW, LCSWA 07/01/2016, 3:09 PM

## 2016-07-02 ENCOUNTER — Encounter: Payer: Self-pay | Admitting: Psychiatry

## 2016-07-02 MED ORDER — LURASIDONE HCL 20 MG PO TABS: 20.0000 mg | ORAL_TABLET | Freq: Every day | ORAL | 0 refills | Status: DC

## 2016-07-02 MED ORDER — TRAZODONE HCL 100 MG PO TABS: 100.0000 mg | ORAL_TABLET | Freq: Every evening | ORAL | 0 refills | Status: DC | PRN

## 2016-07-02 MED ORDER — LURASIDONE HCL 40 MG PO TABS: 40.0000 mg | ORAL_TABLET | Freq: Every day | ORAL | 0 refills | Status: DC

## 2016-07-02 NOTE — Progress Notes (Signed)
Pt denies SI/HI/AVH. PRN given for sleep and anxiety with good effect. Pt expressed desire to go home and continue with outpatient therapy. Attended evening group. Visible in milieu with appropriate behaviors among staff and peers. Encouragement and support provided. Safety maintained. Will continue to monitor.

## 2016-07-02 NOTE — Tx Team (Signed)
Interdisciplinary Treatment and Diagnostic Plan Update  07/02/2016 Time of Session: 1150 Rebekah Burnett MRN: [id number]  Principal Diagnosis: Bipolar disorder [facility])  Secondary Diagnoses: Principal Problem:   Bipolar disorder (HCC) Active Problems:   Borderline personality disorder   Post traumatic stress disorder (PTSD)   Obesity   Current Medications:  Current Facility-Administered Medications  Medication Dose Route Frequency Provider Last Rate Last Dose  . acetaminophen (TYLENOL) tablet 650 mg  650 mg Oral Q6H PRN Jimmy Footman, MD      . alum & mag hydroxide-simeth (MAALOX/MYLANTA) 200-200-20 MG/5ML suspension 30 mL  30 mL Oral Q4H PRN Jimmy Footman, MD      . hydrOXYzine (ATARAX/VISTARIL) tablet 25 mg  25 mg Oral Q4H PRN Jimmy Footman, MD   25 mg at 07/01/16 2114  . lurasidone (LATUDA) tablet 20 mg  20 mg Oral Q supper Darliss Ridgel, MD   20 mg at 07/01/16 1653  . magnesium hydroxide (MILK OF MAGNESIA) suspension 30 mL  30 mL Oral Daily PRN Jimmy Footman, MD      . nicotine (NICODERM CQ - dosed in mg/24 hours) patch 14 mg  14 mg Transdermal Daily Jimmy Footman, MD      . traZODone (DESYREL) tablet 100 mg  100 mg Oral QHS PRN Jimmy Footman, MD   100 mg at 07/01/16 2114   PTA Medications: Prescriptions Prior to Admission  Medication Sig Dispense Refill Last Dose  . hyoscyamine (LEVSIN SL) 0.125 MG SL tablet Place 2 tablets (0.25 mg total) under the tongue every 4 (four) hours as needed. (Patient taking differently: Place 0.25 mg under the tongue every 4 (four) hours as needed for cramping. ) 30 tablet 0 unk  . modafinil (PROVIGIL) 200 MG tablet Take 200 mg by mouth 2 (two) times a week.   Past Week at Unknown time    Patient Stressors: Marital or family conflict Substance abuse  Patient Strengths: Ability for insight Capable of independent living Wellsite geologist fund of  knowledge  Treatment Modalities: Medication Management, Group therapy, Case management,  1 to 1 session with clinician, Psychoeducation, Recreational therapy.   Physician Treatment Plan for Primary Diagnosis: Bipolar disorder (HCC) Long Term Goal(s):     Short Term Goals: Ability to verbalize feelings will improve Ability to disclose and discuss suicidal ideas Ability to demonstrate self-control will improve Compliance with prescribed medications will improve  Medication Management: Evaluate patient's response, side effects, and tolerance of medication regimen.  Therapeutic Interventions: 1 to 1 sessions, Unit Group sessions and Medication administration.  Evaluation of Outcomes: Adequate for Discharge  Physician Treatment Plan for Secondary Diagnosis: Principal Problem:   Bipolar disorder (HCC) Active Problems:   Borderline personality disorder   Post traumatic stress disorder (PTSD)   Obesity  Long Term Goal(s):     Short Term Goals: Ability to verbalize feelings will improve Ability to disclose and discuss suicidal ideas Ability to demonstrate self-control will improve Compliance with prescribed medications will improve     Medication Management: Evaluate patient's response, side effects, and tolerance of medication regimen.  Therapeutic Interventions: 1 to 1 sessions, Unit Group sessions and Medication administration.  Evaluation of Outcomes: Adequate for Discharge   RN Treatment Plan for Primary Diagnosis: Bipolar disorder (HCC) Long Term Goal(s): Knowledge of disease and therapeutic regimen to maintain health will improve  Short Term Goals: Ability to verbalize feelings will improve, Ability to identify and develop effective coping behaviors will improve and Compliance with prescribed medications will improve  Medication Management:  RN will administer medications as ordered by provider, will assess and evaluate patient's response and provide education to patient  for prescribed medication. RN will report any adverse and/or side effects to prescribing provider.  Therapeutic Interventions: 1 on 1 counseling sessions, Psychoeducation, Medication administration, Evaluate responses to treatment, Monitor vital signs and CBGs as ordered, Perform/monitor CIWA, COWS, AIMS and Fall Risk screenings as ordered, Perform wound care treatments as ordered.  Evaluation of Outcomes: Adequate for Discharge   LCSW Treatment Plan for Primary Diagnosis: Bipolar disorder [facility]) Long Term Goal(s): Safe transition to appropriate next level of care at discharge, Engage patient in therapeutic group addressing interpersonal concerns.  Short Term Goals: Engage patient in aftercare planning with referrals and resources  Therapeutic Interventions: Assess for all discharge needs, 1 to 1 time with Social worker, Explore available resources and support systems, Assess for adequacy in community support network, Educate family and significant other(s) on suicide prevention, Complete Psychosocial Assessment, Interpersonal group therapy.  Evaluation of Outcomes: Adequate for Discharge   Progress in Treatment: Attending groups: Yes. Participating in groups: Yes. Taking medication as prescribed: Yes. Toleration medication: Yes. Family/Significant other contact made: Yes, individual(s) contacted:  father Patient understands diagnosis: Yes. Discussing patient identified problems/goals with staff: Yes. Medical problems stabilized or resolved: Yes. Denies suicidal/homicidal ideation: Yes. Issues/concerns per patient self-inventory: No. Other: none  New problem(s) identified: No, Describe:  none  New Short Term/Long Term Goal(s):  Discharge Plan or Barriers: [facility]  Reason for Continuation of Hospitalization: Other; describe none: discharge today.  Estimated Length of Stay: discharge today. Attendees: Patient: 07/02/2016   Physician:  Dr. Ardyth Harps, MD 07/02/2016   Nursing: Hulan Amato, RN 07/02/2016   RN Care Manager: 07/02/2016   Social Worker: Daleen Squibb, LCSW 07/02/2016   Recreational Therapist: Hershal Coria, LRT/CTRS  07/02/2016   Other:  07/02/2016   Other:  07/02/2016   Other: 07/02/2016         Scribe for Treatment Team: Lorri Frederick, LCSW 07/02/2016 2:21 PM

## 2016-07-02 NOTE — Progress Notes (Signed)
  [facility] Adult Case Management Discharge Plan :  Will you be returning to the same living situation after discharge:  Yes,  lives alone At discharge, do you have transportation home?: Yes,  family Do you have the ability to pay for your medications: Yes,  BCBS  Release of information consent forms completed and in the chart;  Patient's signature needed at discharge.  Patient to Follow up at: Follow-up Information    [facility] Go on 07[facility]10/2016.   Why:  Please attend your follow up appointment with Fransisca Kaufmann at [facility] on 07/10/16 at 11am.  Please bring a copy of your hospital discharge paperwork. Contact information: [address] [place] [address] [phone]        [facility]. Go in 7 day(s).   Specialty:  Behavioral Health Why:  Please attend the walk in clinic at [facility] between 8am-3pm Monday-Friday.  Please bring a copy of your hospital discharge paperwork. Contact information: [address] [place] [address] [phone]           Next level of care provider has access to [facility] Link:no  Safety Planning and Suicide Prevention discussed: Yes,  with father  Have you used any form of tobacco in the last 30 days? (Cigarettes, Smokeless Tobacco, Cigars, and/or Pipes): Yes  Has patient been referred to the Quitline?: Patient refused referral  Patient has been referred for addiction treatment: Yes  Lorri Frederick, LCSW 07/02/2016, 1:52 PM

## 2016-07-02 NOTE — [facility] Group Notes (Signed)
[facility] LCSW Group Therapy   07/02/2016 9:30 am Type of Therapy: Group Therapy   Participation Level: Active   Participation Quality: Attentive, Sharing and Supportive   Affect: Flat   Cognitive: Alert and Oriented   Insight: Developing/Improving and Engaged   Engagement in Therapy: Developing/Improving and Engaged   Modes of Intervention: Clarification, Confrontation, Discussion, Education, Exploration,  Limit-setting, Orientation, Problem-solving, Rapport Building, Dance movement psychotherapist, Socialization and Support   Summary of Progress/Problems: Pt identified obstacles faced currently and processed barriers involved in overcoming these obstacles. Pt identified steps necessary for overcoming these obstacles and explored motivation (internal and external) for facing these difficulties head on. Pt further identified one area of concern in their lives and chose a goal to focus on for today.   Hampton Abbot, MSW, LCSWA 07/02/2016, 11:52 AM

## 2016-07-02 NOTE — Progress Notes (Signed)
Denies SI/HI/AVH.  Affect brighter.  Pleasant and cooperative.   Discharge instructions given, verbalized understanding.  Prescriptions given and personal belongings returned.  Escorted off unit by this Clinical research associate to main entrance to travel home with family.

## 2016-07-02 NOTE — Progress Notes (Signed)
Recreation Therapy Notes  Date: 04.16.18 Time: 1:15 pm Location: Craft Room  Group Topic: Wellness  Goal Area(s) Addresses:  Patient will identify at least one item per dimension of health. Patient will examine areas they are deficient in.  Behavioral Response: Attentive, Interactive  Intervention: 6 Dimensions of Health  Activity: Patients were given a definition sheet with each dimension of health define and a worksheet listing each dimension of health. Patients were instructed to write at least one item they were currently doing in each dimension.  Education:LRT educated patients on ways to improve each dimension.  Education Outcome: Acknowledges education/In group clarification offered  Clinical Observations/Feedback: Patient wrote items in each dimension. Patient contributed to group discussion by stating how she can improve certain dimensions.  Jacquelynn Cree, LRT/CTRS 07/02/2016 2:12 PM
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
## 2016-07-03 NOTE — Progress Notes (Signed)
Contacted by pt as insurance is requesting prior approval for latuda 40 mg.  Pt has never been treated with any other antipsychotics.  Will replace latuda 40 mg for abilify 5 mg po q day---prescription called to [facility] [phone]

## 2018-05-12 ENCOUNTER — Other Ambulatory Visit: Payer: Self-pay | Admitting: Family Medicine

## 2018-05-12 DIAGNOSIS — R112 Nausea with vomiting, unspecified: Secondary | ICD-10-CM

## 2018-05-13 ENCOUNTER — Other Ambulatory Visit: Payer: Self-pay

## 2018-05-16 ENCOUNTER — Other Ambulatory Visit: Payer: Self-pay

## 2018-12-08 ENCOUNTER — Other Ambulatory Visit: Payer: Self-pay | Admitting: Family Medicine

## 2018-12-08 ENCOUNTER — Ambulatory Visit
Admission: RE | Admit: 2018-12-08 | Discharge: 2018-12-08 | Disposition: A | Discharge status: Home / Self Care | Payer: BLUE CROSS/BLUE SHIELD | Source: Ambulatory Visit | Attending: Family Medicine | Admitting: Family Medicine

## 2018-12-08 ENCOUNTER — Other Ambulatory Visit: Payer: Self-pay

## 2018-12-08 DIAGNOSIS — M545 Low back pain, unspecified: Secondary | ICD-10-CM

## 2020-02-04 ENCOUNTER — Emergency Department (HOSPITAL_COMMUNITY)
Admission: EM | Admit: 2020-02-04 | Discharge: 2020-02-04 | Disposition: A | Discharge status: Home / Self Care | Payer: BLUE CROSS/BLUE SHIELD | Attending: Emergency Medicine | Admitting: Emergency Medicine

## 2020-02-04 ENCOUNTER — Other Ambulatory Visit: Payer: Self-pay

## 2020-02-04 ENCOUNTER — Emergency Department (HOSPITAL_COMMUNITY): Payer: BLUE CROSS/BLUE SHIELD

## 2020-02-04 ENCOUNTER — Encounter (HOSPITAL_COMMUNITY): Payer: Self-pay

## 2020-02-04 DIAGNOSIS — N3090 Cystitis, unspecified without hematuria: Secondary | ICD-10-CM | POA: Diagnosis not present

## 2020-02-04 DIAGNOSIS — R3915 Urgency of urination: Secondary | ICD-10-CM | POA: Insufficient documentation

## 2020-02-04 DIAGNOSIS — K5792 Diverticulitis of intestine, part unspecified, without perforation or abscess without bleeding: Secondary | ICD-10-CM | POA: Diagnosis not present

## 2020-02-04 DIAGNOSIS — F17210 Nicotine dependence, cigarettes, uncomplicated: Secondary | ICD-10-CM | POA: Insufficient documentation

## 2020-02-04 DIAGNOSIS — R000 Tachycardia, unspecified: Secondary | ICD-10-CM | POA: Diagnosis not present

## 2020-02-04 DIAGNOSIS — Z20822 Contact with and (suspected) exposure to covid-19: Secondary | ICD-10-CM | POA: Diagnosis not present

## 2020-02-04 DIAGNOSIS — D72829 Elevated white blood cell count, unspecified: Secondary | ICD-10-CM | POA: Insufficient documentation

## 2020-02-04 DIAGNOSIS — R1012 Left upper quadrant pain: Secondary | ICD-10-CM | POA: Diagnosis present

## 2020-02-04 LAB — CBC WITH DIFFERENTIAL/PLATELET
Abs Immature Granulocytes: 0.06 10*3/uL (ref 0.00–0.07)
Basophils Absolute: 0 10*3/uL (ref 0.0–0.1)
Basophils Relative: 0 %
Eosinophils Absolute: 0 10*3/uL (ref 0.0–0.5)
Eosinophils Relative: 0 %
HCT: 48.2 % — ABNORMAL HIGH (ref 36.0–46.0)
Hemoglobin: 15.5 g/dL — ABNORMAL HIGH (ref 12.0–15.0)
Immature Granulocytes: 1 %
Lymphocytes Relative: 11 %
Lymphs Abs: 1.3 10*3/uL (ref 0.7–4.0)
MCH: 27.6 pg (ref 26.0–34.0)
MCHC: 32.2 g/dL (ref 30.0–36.0)
MCV: 85.8 fL (ref 80.0–100.0)
Monocytes Absolute: 1 10*3/uL (ref 0.1–1.0)
Monocytes Relative: 8 %
Neutro Abs: 9.5 10*3/uL — ABNORMAL HIGH (ref 1.7–7.7)
Neutrophils Relative %: 80 %
Platelets: 290 10*3/uL (ref 150–400)
RBC: 5.62 MIL/uL — ABNORMAL HIGH (ref 3.87–5.11)
RDW: 13.1 % (ref 11.5–15.5)
WBC: 12 10*3/uL — ABNORMAL HIGH (ref 4.0–10.5)
nRBC: 0 % (ref 0.0–0.2)

## 2020-02-04 LAB — RESP PANEL BY RT-PCR (FLU A&B, COVID) ARPGX2
Influenza A by PCR: NEGATIVE
Influenza B by PCR: NEGATIVE
SARS Coronavirus 2 by RT PCR: NEGATIVE

## 2020-02-04 LAB — URINALYSIS, ROUTINE W REFLEX MICROSCOPIC
Bilirubin Urine: NEGATIVE
Glucose, UA: NEGATIVE mg/dL
Ketones, ur: 20 mg/dL — AB
Nitrite: NEGATIVE
Protein, ur: NEGATIVE mg/dL
Specific Gravity, Urine: 1.019 (ref 1.005–1.030)
pH: 6 (ref 5.0–8.0)

## 2020-02-04 LAB — COMPREHENSIVE METABOLIC PANEL
ALT: 26 U/L (ref 0–44)
AST: 16 U/L (ref 15–41)
Albumin: 4.4 g/dL (ref 3.5–5.0)
Alkaline Phosphatase: 63 U/L (ref 38–126)
Anion gap: 11 (ref 5–15)
BUN: 8 mg/dL (ref 6–20)
CO2: 23 mmol/L (ref 22–32)
Calcium: 8.9 mg/dL (ref 8.9–10.3)
Chloride: 103 mmol/L (ref 98–111)
Creatinine, Ser: 0.8 mg/dL (ref 0.44–1.00)
GFR, Estimated: >60 mL/min (ref >60)
Glucose, Bld: 91 mg/dL (ref 70–99)
Potassium: 4 mmol/L (ref 3.5–5.1)
Sodium: 137 mmol/L (ref 135–145)
Total Bilirubin: 1.1 mg/dL (ref 0.3–1.2)
Total Protein: 7.7 g/dL (ref 6.5–8.1)

## 2020-02-04 LAB — APTT: aPTT: 31 seconds (ref 24–36)

## 2020-02-04 LAB — I-STAT BETA HCG BLOOD, ED (MC, WL, AP ONLY): I-stat hCG, quantitative: <5 m[IU]/mL (ref <5)

## 2020-02-04 LAB — PROTIME-INR
INR: 1.1 (ref 0.8–1.2)
Prothrombin Time: 13.6 seconds (ref 11.4–15.2)

## 2020-02-04 LAB — LACTIC ACID, PLASMA: Lactic Acid, Venous: 0.9 mmol/L (ref 0.5–1.9)

## 2020-02-04 MED ORDER — HYDROCODONE-ACETAMINOPHEN 5-325 MG PO TABS
1.0000 | ORAL_TABLET | Freq: Four times a day (QID) | ORAL | 0 refills | Status: AC | PRN
Start: 2020-02-04 — End: ?

## 2020-02-04 MED ORDER — SODIUM CHLORIDE 0.9 % IV SOLN
2.0000 g | INTRAVENOUS | Status: DC
  Administered 2020-02-04: 2 g via INTRAVENOUS
  Filled 2020-02-04: qty 20

## 2020-02-04 MED ORDER — LACTATED RINGERS IV SOLN: INTRAVENOUS | Status: DC

## 2020-02-04 MED ORDER — IOHEXOL 300 MG/ML  SOLN
100.0000 mL | Freq: Once | INTRAMUSCULAR | Status: AC | PRN
  Administered 2020-02-04: 100 mL via INTRAVENOUS

## 2020-02-04 MED ORDER — FENTANYL CITRATE (PF) 100 MCG/2ML IJ SOLN
50.0000 ug | Freq: Once | INTRAMUSCULAR | Status: AC
  Administered 2020-02-04: 50 ug via INTRAVENOUS
  Filled 2020-02-04: qty 2

## 2020-02-04 MED ORDER — ACETAMINOPHEN 325 MG PO TABS
650.0000 mg | ORAL_TABLET | Freq: Once | ORAL | Status: AC
  Administered 2020-02-04: 650 mg via ORAL
  Filled 2020-02-04: qty 2

## 2020-02-04 MED ORDER — SODIUM CHLORIDE 0.9 % IV SOLN: 1.0000 g | INTRAVENOUS | Status: DC

## 2020-02-04 MED ORDER — AMOXICILLIN-POT CLAVULANATE 875-125 MG PO TABS: 1.0000 | ORAL_TABLET | Freq: Two times a day (BID) | ORAL | 0 refills | Status: AC

## 2020-02-04 NOTE — Progress Notes (Signed)
Code Sepsis tracking by eLINK 

## 2020-02-04 NOTE — ED Provider Notes (Signed)
[facility] EMERGENCY DEPARTMENT Provider Note  CSN: [id number] Arrival date & time: 02/04/20 1958    History Chief Complaint  Patient presents with  . Flank Pain    HPI  Rebekah Burnett is a 29 y.o. female with no significant PMH reports about 24-36 hours of L flank pain, urinary urgency without dysuria, and fever with tachycardia. She went to [facility] and was sent to the ED for evaluation. She has had some nausea but no vomiting. No cough, CP, SOB, diarrhea. No rash. She has not had UTI before.    History reviewed. No pertinent past medical history.  Past Surgical History:  Procedure Laterality Date  . TONSILLECTOMY      Family History  Problem Relation Age of Onset  . Diabetes Maternal Grandmother     Social History   Tobacco Use  . Smoking status: Current Some Day Smoker    Types: Cigarettes  . Smokeless tobacco: Never Used  Substance Use Topics  . Alcohol use: Yes    Comment: occasional  . Drug use: Yes    Types: Marijuana     Home Medications Prior to Admission medications   Medication Sig Start Date End Date Taking? Authorizing Provider  modafinil (PROVIGIL) 100 MG tablet Take 1 tablet by mouth daily. 12/30/19  Yes [provider]  Testosterone 20.25 MG/ACT (1.62%) GEL Place 2 Pump onto the skin daily. 12/11/19 12/10/20 Yes [provider]  amoxicillin-clavulanate (AUGMENTIN) 875-125 MG tablet Take 1 tablet by mouth every 12 (twelve) hours. 02/04/20   Pollyann Savoy, MD  HYDROcodone-acetaminophen (NORCO/VICODIN) 5-325 MG tablet Take 1 tablet by mouth every 6 (six) hours as needed for severe pain. 02/04/20   Pollyann Savoy, MD  lurasidone (LATUDA) 40 MG TABS tablet Take 1 tablet (40 mg total) by mouth daily with supper. Patient not taking: Reported on 02/04/2020 07/02/16 02/04/20  Jimmy Footman, MD  traZODone (DESYREL) 100 MG tablet Take 1 tablet (100 mg total) by mouth at bedtime as needed for sleep. Patient not taking:  Reported on 02/04/2020 07/02/16 02/04/20  Jimmy Footman, MD     Allergies    Lamotrigine   Review of Systems   Review of Systems A comprehensive review of systems was completed and negative except as noted in HPI.    Physical Exam BP 129/64   Pulse (!) 104   Temp (!) 101.4 F (38.6 C) (Oral)   Resp (!) 21   Ht 5\' 5"  (1.651 m)   Wt (!) 140.6 kg   SpO2 97%   BMI 51.59 kg/m   Physical Exam Vitals and nursing note reviewed.  Constitutional:      Appearance: Normal appearance.  HENT:     Head: Normocephalic and atraumatic.     Nose: Nose normal.     Mouth/Throat:     Mouth: Mucous membranes are moist.  Eyes:     Extraocular Movements: Extraocular movements intact.     Conjunctiva/sclera: Conjunctivae normal.  Cardiovascular:     Rate and Rhythm: Tachycardia present.  Pulmonary:     Effort: Pulmonary effort is normal.     Breath sounds: Normal breath sounds.  Abdominal:     General: Abdomen is flat.     Palpations: Abdomen is soft.     Tenderness: There is abdominal tenderness (LUQ and L flank).  Musculoskeletal:        General: No swelling. Normal range of motion.     Cervical back: Neck supple.  Skin:    General: Skin  is warm and dry.  Neurological:     General: No focal deficit present.     Mental Status: She is alert.  Psychiatric:        Mood and Affect: Mood normal.      ED Results / Procedures / Treatments   Labs (all labs ordered are listed, but only abnormal results are displayed) Labs Reviewed  CBC WITH DIFFERENTIAL/PLATELET - Abnormal; Notable for the following components:      Result Value   WBC 12.0 (*)    RBC 5.62 (*)    Hemoglobin 15.5 (*)    HCT 48.2 (*)    Neutro Abs 9.5 (*)    All other components within normal limits  URINALYSIS, ROUTINE W REFLEX MICROSCOPIC - Abnormal; Notable for the following components:   Hgb urine dipstick SMALL (*)    Ketones, ur 20 (*)    Leukocytes,Ua MODERATE (*)    Bacteria, UA RARE (*)     All other components within normal limits  RESP PANEL BY RT-PCR (FLU A&B, COVID) ARPGX2  CULTURE, BLOOD (ROUTINE X 2)  CULTURE, BLOOD (ROUTINE X 2)  URINE CULTURE  LACTIC ACID, PLASMA  COMPREHENSIVE METABOLIC PANEL  PROTIME-INR  APTT  I-STAT BETA HCG BLOOD, ED ([facility], [facility], [facility] ONLY)    EKG EKG Interpretation  Date/Time:  Thursday February 04 2020 20:54:27 EST Ventricular Rate:  112 PR Interval:    QRS Duration: 85 QT Interval:  314 QTC Calculation: 429 R Axis:   46 Text Interpretation: Sinus tachycardia Otherwise within normal limits Since last tracing Rate faster Confirmed by Susy Frizzle [phone]) on 02/04/2020 9:03:41 PM   Radiology CT Abdomen Pelvis W Contrast  Result Date: 02/04/2020 CLINICAL DATA:  Fever abdominal pain EXAM: CT ABDOMEN AND PELVIS WITH CONTRAST TECHNIQUE: Multidetector CT imaging of the abdomen and pelvis was performed using the standard protocol following bolus administration of intravenous contrast. CONTRAST:  OMNIPAQUE IOHEXOL 300 MG/ML  SOLN COMPARISON:  None. FINDINGS: Lower chest: The visualized heart size within normal limits. No pericardial fluid/thickening. No hiatal hernia. The visualized portions of the lungs are clear. Hepatobiliary: The liver is normal in density without focal abnormality.The main portal vein is patent. No evidence of calcified gallstones, gallbladder wall thickening or biliary dilatation. Pancreas: Unremarkable. No pancreatic ductal dilatation or surrounding inflammatory changes. Spleen: Normal in size without focal abnormality. Adrenals/Urinary Tract: Both adrenal glands appear normal. The kidneys and collecting system appear normal without evidence of urinary tract calculus or hydronephrosis. Bladder is unremarkable. Stomach/Bowel: The stomach and small bowel are normal in appearance. There is a focal segment of descending colon with wall thickening and diverticula and surrounding fat stranding changes. No loculated fluid  collection or free air is seen. Vascular/Lymphatic: There are no enlarged mesenteric, retroperitoneal, or pelvic lymph nodes. No significant vascular findings are present. Reproductive: IUD seen within the endometrial canal. Other: No evidence of abdominal wall mass or hernia. Musculoskeletal: No acute or significant osseous findings. IMPRESSION: Findings consistent with descending diverticulitis. No loculated fluid collections or free air. Electronically Signed   By: Jonna Clark M.D.   On: 02/04/2020 22:53   DG Chest Port 1 View  Result Date: 02/04/2020 CLINICAL DATA:  29 year old female with possible sepsis. EXAM: PORTABLE CHEST 1 VIEW COMPARISON:  Chest radiograph dated 08/06/2003. FINDINGS: The heart size and mediastinal contours are within normal limits. Both lungs are clear. The visualized skeletal structures are unremarkable. IMPRESSION: No active disease. Electronically Signed   By: Ceasar Mons.D.  On: 02/04/2020 20:32    Procedures Procedures  Medications Ordered in the ED Medications  lactated ringers infusion ( Intravenous New Bag/Given 02/04/20 2157)  cefTRIAXone (ROCEPHIN) 2 g in sodium chloride 0.9 % 100 mL IVPB (0 g Intravenous Stopped 02/04/20 2157)  acetaminophen (TYLENOL) tablet 650 mg (650 mg Oral Given 02/04/20 2108)  fentaNYL (SUBLIMAZE) injection 50 mcg (50 mcg Intravenous Given 02/04/20 2223)  iohexol (OMNIPAQUE) 300 MG/ML solution 100 mL (100 mLs Intravenous Contrast Given 02/04/20 2233)     MDM Rules/Calculators/A&P MDM Patient noted to be febrile and tachycardic on arrival. Sepsis order set initiated, IV Abx for urine source. ED Course  I have reviewed the triage vital signs and the nursing notes.  Pertinent labs & imaging results that were available during my care of the patient were reviewed by me and considered in my medical decision making (see chart for details).  Clinical Course as of Feb 04 2307  Thu Feb 04, 2020  2129 CBC with elevated WBC.     [CS]  2203 CMP and Lactic acid neg.    [CS]  2253 UA with signs of infection as her likely source. CT pending for eval of structural or obstructive process.    [CS]  2259 CT with signs of uncomplicated diverticulitis in descending colon which correlates with location of symptoms. Patient will be discharged with Rx for Augmentin pending Urine culture.    [CS]    Clinical Course User Index [CS] Pollyann Savoy, MD    Final Clinical Impression(s) / ED Diagnoses Final diagnoses:  Diverticulitis  Cystitis    Rx / DC Orders ED Discharge Orders         Ordered    amoxicillin-clavulanate (AUGMENTIN) 875-125 MG tablet  Every 12 hours        02/04/20 2305    HYDROcodone-acetaminophen (NORCO/VICODIN) 5-325 MG tablet  Every 6 hours PRN        02/04/20 2308           Pollyann Savoy, MD 02/04/20 2308

## 2020-02-04 NOTE — ED Triage Notes (Signed)
Pt sent to ER from [facility] to be evaluated for kidney infection. Pt reports fever of 102 at home, tachycardia and dysuria.

## 2020-02-05 LAB — URINE CULTURE

## 2020-02-09 LAB — CULTURE, BLOOD (ROUTINE X 2)
Culture: NO GROWTH
Culture: NO GROWTH
Special Requests: ADEQUATE
Special Requests: ADEQUATE

## 2022-03-15 IMAGING — CT CT ABD-PELV W/ CM
2 of 4 series · 16 of 46 positions shown, 18 images · IV contrast (omnipaque)
Comparison: None.

CLINICAL DATA: Fever abdominal pain

EXAM:
CT ABDOMEN AND PELVIS WITH CONTRAST
TECHNIQUE: Multidetector CT imaging of the abdomen and pelvis was performed
using the standard protocol following bolus administration of
intravenous contrast.
CONTRAST:  100mL OMNIPAQUE IOHEXOL 300 MG/ML  SOLN

[Series 2: axial st · axial · 0.78mm/px · z∈[-531,-81]mm · 13 of 104 slices shown, 15 images]
[im 7/104  soft-tissue]
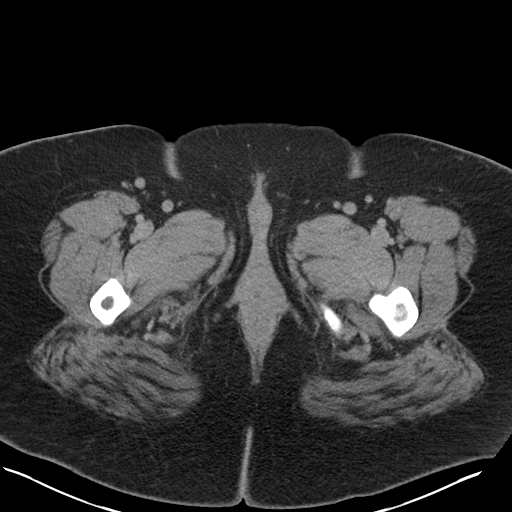
[im 7/104  bone]
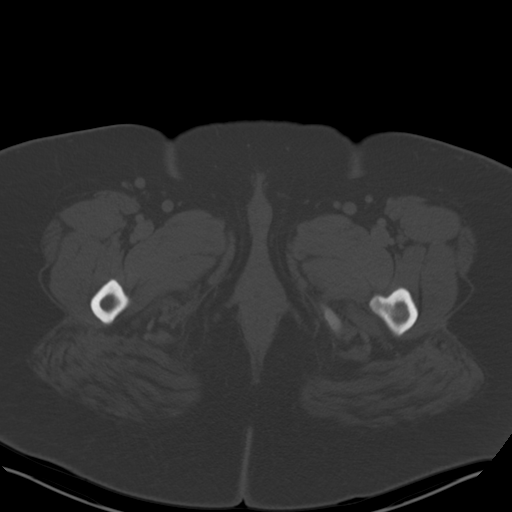
[im 13/104  soft-tissue]
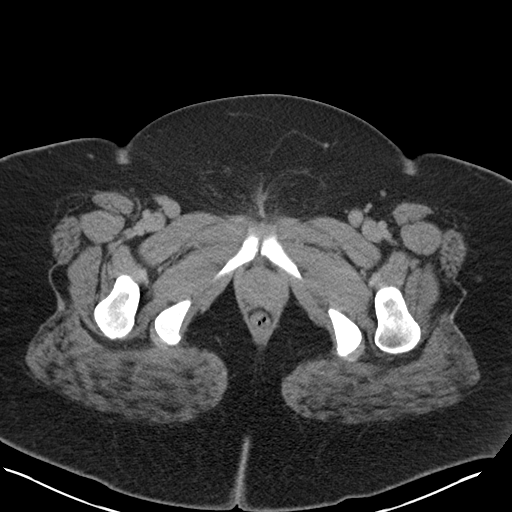
[im 25/104  soft-tissue]
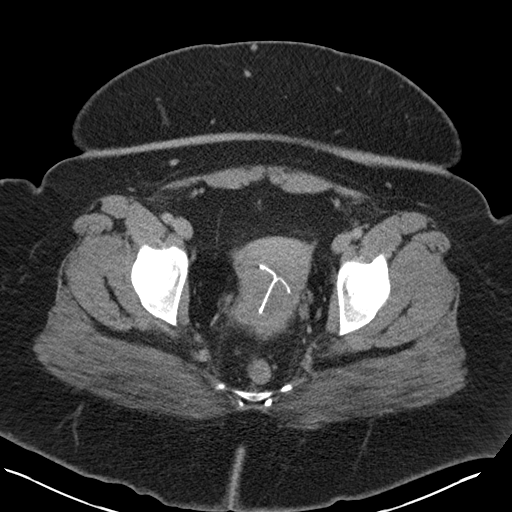
[im 31/104  soft-tissue]
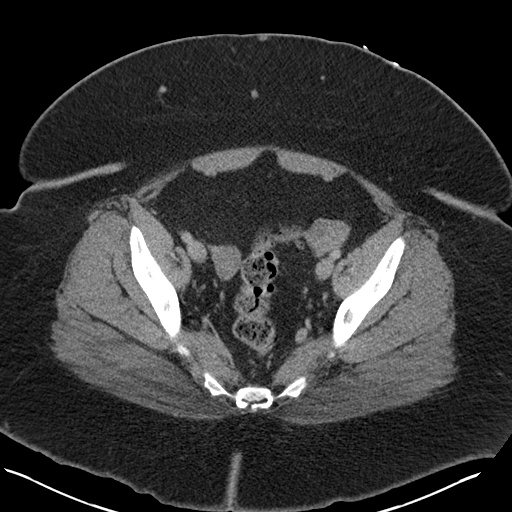
[im 37/104  soft-tissue]
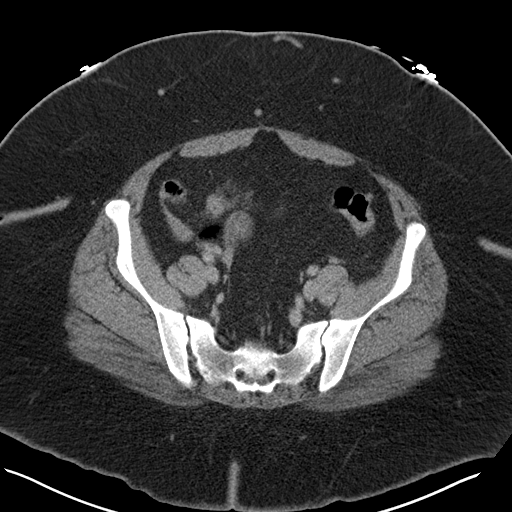
[im 43/104  soft-tissue]
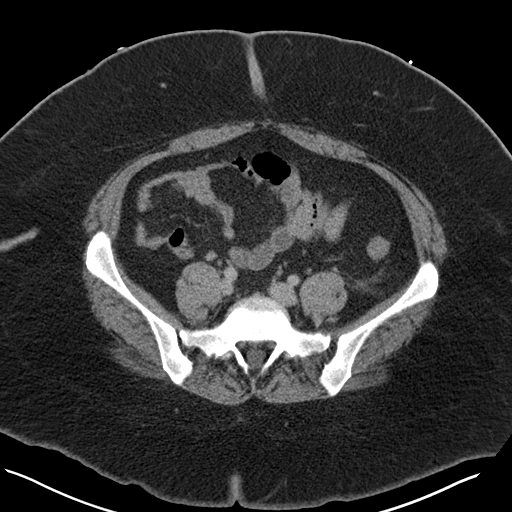
[im 55/104  soft-tissue]
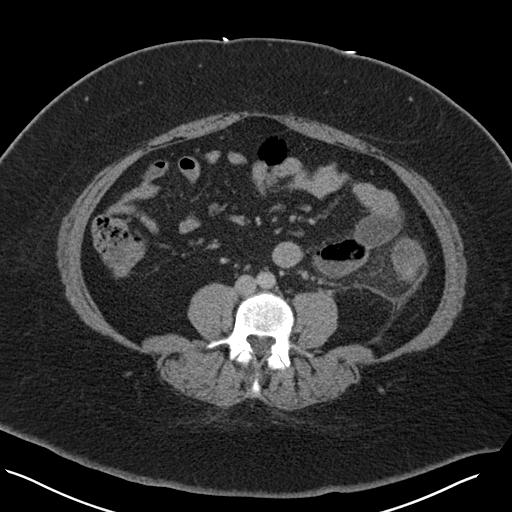
[im 61/104  soft-tissue]
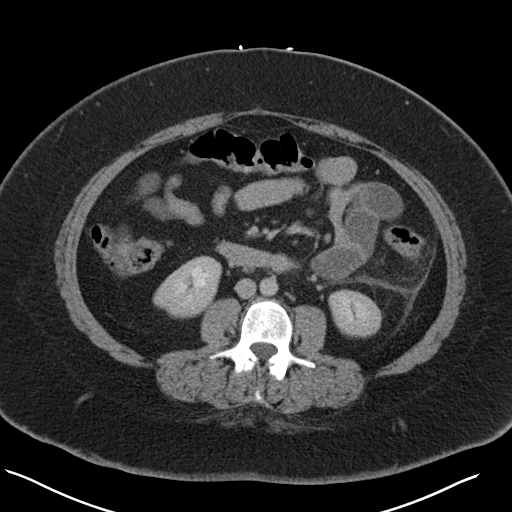
[im 67/104  soft-tissue]
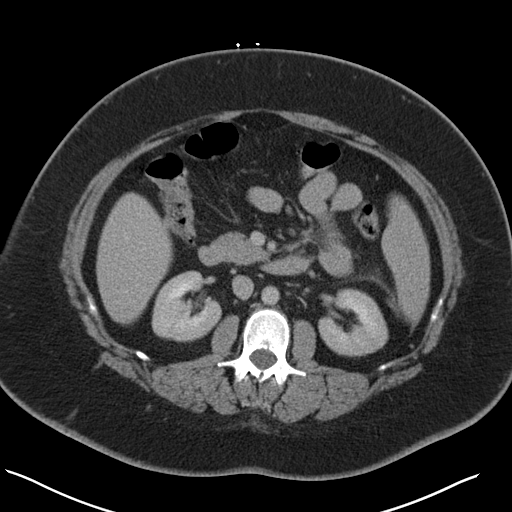
[im 67/104  bone]
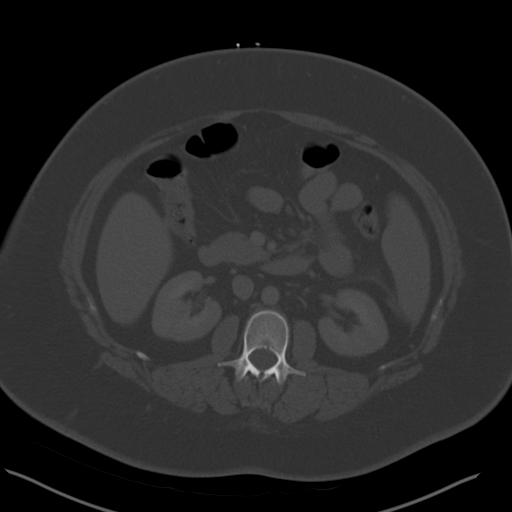
[im 73/104  soft-tissue]
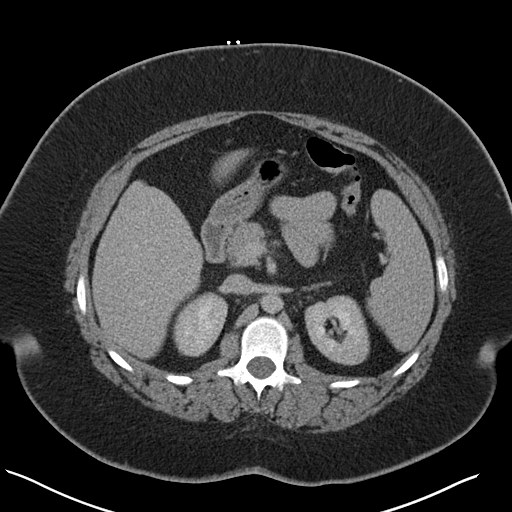
[im 79/104  soft-tissue]
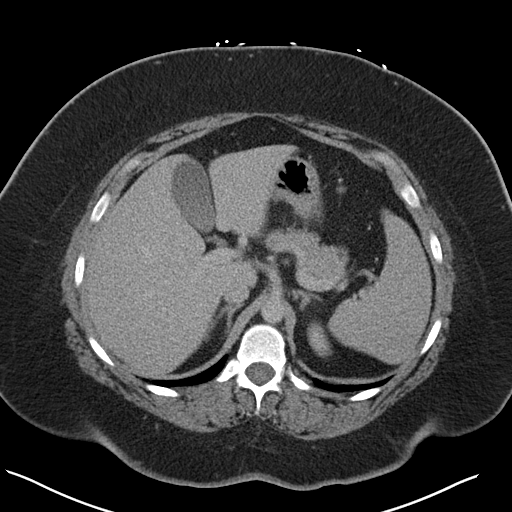
[im 91/104  soft-tissue]
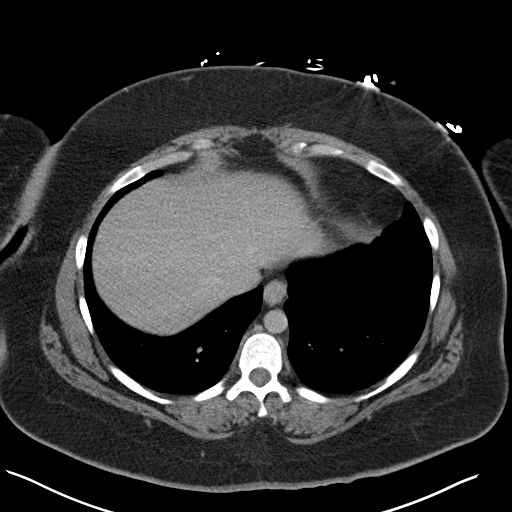
[im 97/104  soft-tissue]
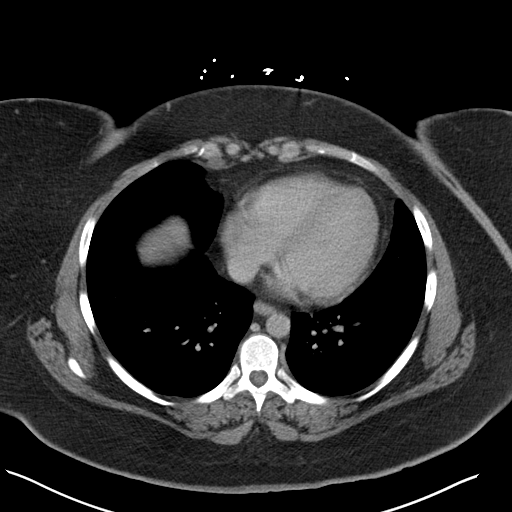

[Series 5: coronal st · coronal · 0.83mm/px · 3 of 158 slices shown]
[im 53/158  soft-tissue]
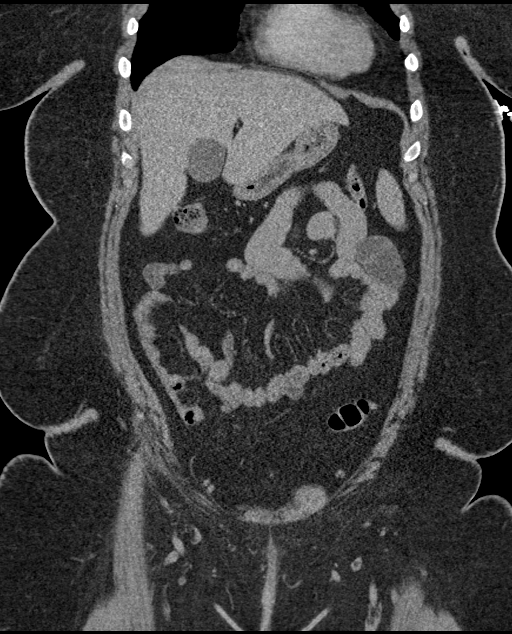
[im 70/158  soft-tissue]
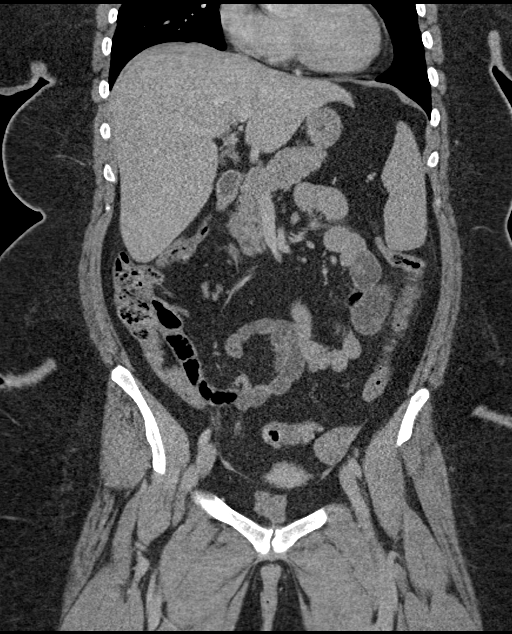
[im 88/158  soft-tissue]
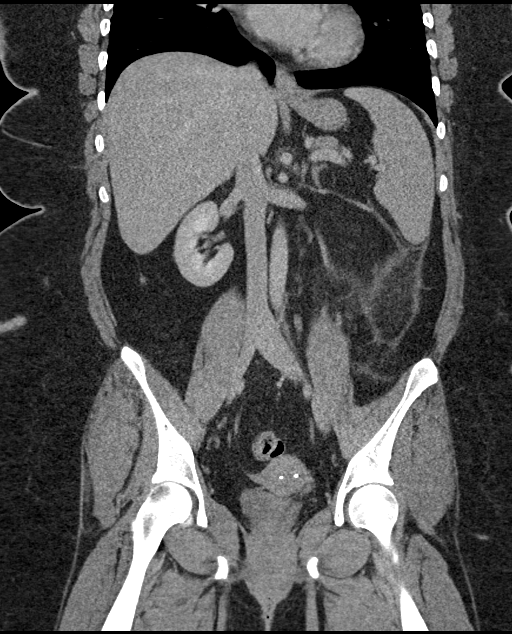

[16 of 46 positions shown; findings below may reference images not displayed]

FINDINGS: Lower chest: The visualized heart size within normal limits. No
pericardial fluid/thickening.

No hiatal hernia.

The visualized portions of the lungs are clear.

Hepatobiliary: The liver is normal in density without focal
abnormality.The main portal vein is patent. No evidence of calcified
gallstones, gallbladder wall thickening or biliary dilatation.

Pancreas: Unremarkable. No pancreatic ductal dilatation or
surrounding inflammatory changes.

Spleen: Normal in size without focal abnormality.

Adrenals/Urinary Tract: Both adrenal glands appear normal. The
kidneys and collecting system appear normal without evidence of
urinary tract calculus or hydronephrosis. Bladder is unremarkable.

Stomach/Bowel: The stomach and small bowel are normal in appearance.
There is a focal segment of descending colon with wall thickening
and diverticula and surrounding fat stranding changes. No loculated
fluid collection or free air is seen.

Vascular/Lymphatic: There are no enlarged mesenteric,
retroperitoneal, or pelvic lymph nodes. No significant vascular
findings are present.

Reproductive: IUD seen within the endometrial canal.

Other: No evidence of abdominal wall mass or hernia.

Musculoskeletal: No acute or significant osseous findings.
IMPRESSION: Findings consistent with descending diverticulitis. No loculated
fluid collections or free air.
